# Patient Record
Sex: Female | Born: 1990 | Race: Black or African American | Hispanic: No | Marital: Single | Smoking: Former smoker
Health system: Southern US, Community
[De-identification: ages and names within clinical notes are randomized; demographics above are authoritative.]

## PROBLEM LIST (undated history)

## (undated) ENCOUNTER — Inpatient Hospital Stay (HOSPITAL_COMMUNITY): Payer: Self-pay

## (undated) ENCOUNTER — Emergency Department (HOSPITAL_BASED_OUTPATIENT_CLINIC_OR_DEPARTMENT_OTHER): Admission: EM | Payer: Medicaid Other

## (undated) DIAGNOSIS — D649 Anemia, unspecified: Secondary | ICD-10-CM

## (undated) DIAGNOSIS — I82890 Acute embolism and thrombosis of other specified veins: Secondary | ICD-10-CM

## (undated) DIAGNOSIS — D509 Iron deficiency anemia, unspecified: Secondary | ICD-10-CM

## (undated) HISTORY — DX: Anemia, unspecified: D64.9

## (undated) HISTORY — DX: Acute embolism and thrombosis of other specified veins: I82.890

---
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
## 2009-03-04 ENCOUNTER — Emergency Department (HOSPITAL_COMMUNITY): Admission: EM | Admit: 2009-03-04 | Discharge: 2009-03-05 | Payer: Self-pay | Admitting: Emergency Medicine

## 2009-04-04 ENCOUNTER — Emergency Department (HOSPITAL_COMMUNITY): Admission: EM | Admit: 2009-04-04 | Discharge: 2009-04-04 | Payer: Self-pay | Admitting: Emergency Medicine

## 2009-04-07 ENCOUNTER — Emergency Department (HOSPITAL_COMMUNITY): Admission: EM | Admit: 2009-04-07 | Discharge: 2009-04-07 | Payer: Self-pay | Admitting: Emergency Medicine

## 2009-08-05 ENCOUNTER — Emergency Department (HOSPITAL_COMMUNITY): Admission: EM | Admit: 2009-08-05 | Discharge: 2009-08-05 | Payer: Self-pay | Admitting: Emergency Medicine

## 2010-01-20 ENCOUNTER — Ambulatory Visit (HOSPITAL_COMMUNITY)
Admission: RE | Admit: 2010-01-20 | Discharge: 2010-01-20 | Payer: Self-pay | Source: Home / Self Care | Attending: Family Medicine | Admitting: Family Medicine

## 2010-02-13 ENCOUNTER — Encounter: Payer: Self-pay | Admitting: Obstetrics

## 2010-04-03 ENCOUNTER — Inpatient Hospital Stay (HOSPITAL_COMMUNITY)
Admission: AD | Admit: 2010-04-03 | Discharge: 2010-04-03 | Disposition: A | Discharge status: Home / Self Care | Payer: Medicaid Other | Source: Ambulatory Visit | Attending: Obstetrics and Gynecology | Admitting: Obstetrics and Gynecology

## 2010-04-03 DIAGNOSIS — O479 False labor, unspecified: Secondary | ICD-10-CM | POA: Insufficient documentation

## 2010-04-12 ENCOUNTER — Other Ambulatory Visit: Payer: Self-pay | Admitting: Family Medicine

## 2010-04-12 DIAGNOSIS — O480 Post-term pregnancy: Secondary | ICD-10-CM

## 2010-04-13 ENCOUNTER — Other Ambulatory Visit: Payer: Self-pay | Admitting: Family Medicine

## 2010-04-13 DIAGNOSIS — O480 Post-term pregnancy: Secondary | ICD-10-CM

## 2010-04-15 ENCOUNTER — Ambulatory Visit (HOSPITAL_COMMUNITY)
Admission: RE | Admit: 2010-04-15 | Discharge: 2010-04-15 | Disposition: A | Discharge status: Home / Self Care | Payer: Medicaid Other | Source: Ambulatory Visit | Attending: Family Medicine | Admitting: Family Medicine

## 2010-04-15 ENCOUNTER — Encounter (HOSPITAL_COMMUNITY): Payer: Self-pay

## 2010-04-15 DIAGNOSIS — O480 Post-term pregnancy: Secondary | ICD-10-CM

## 2010-04-15 DIAGNOSIS — O34219 Maternal care for unspecified type scar from previous cesarean delivery: Secondary | ICD-10-CM | POA: Insufficient documentation

## 2010-04-15 DIAGNOSIS — O0930 Supervision of pregnancy with insufficient antenatal care, unspecified trimester: Secondary | ICD-10-CM | POA: Insufficient documentation

## 2010-04-16 ENCOUNTER — Inpatient Hospital Stay (HOSPITAL_COMMUNITY)
Admission: AD | Admit: 2010-04-16 | Discharge: 2010-04-16 | Disposition: A | Discharge status: Home / Self Care | Payer: Medicaid Other | Source: Ambulatory Visit | Attending: Obstetrics & Gynecology | Admitting: Obstetrics & Gynecology

## 2010-04-16 ENCOUNTER — Inpatient Hospital Stay (HOSPITAL_COMMUNITY): Payer: Medicaid Other

## 2010-04-16 DIAGNOSIS — O480 Post-term pregnancy: Secondary | ICD-10-CM

## 2010-04-17 LAB — CULTURE, ROUTINE-ABSCESS

## 2010-04-17 LAB — POCT PREGNANCY, URINE: Preg Test, Ur: NEGATIVE

## 2010-04-18 ENCOUNTER — Ambulatory Visit (HOSPITAL_COMMUNITY): Payer: Medicaid Other

## 2010-04-19 ENCOUNTER — Other Ambulatory Visit: Payer: Medicaid Other

## 2010-04-19 DIAGNOSIS — Z331 Pregnant state, incidental: Secondary | ICD-10-CM

## 2010-04-19 DIAGNOSIS — O480 Post-term pregnancy: Secondary | ICD-10-CM

## 2010-04-21 ENCOUNTER — Ambulatory Visit (HOSPITAL_COMMUNITY)
Admission: RE | Admit: 2010-04-21 | Discharge: 2010-04-21 | Disposition: A | Discharge status: Home / Self Care | Payer: Medicaid Other | Source: Ambulatory Visit | Attending: Family Medicine | Admitting: Family Medicine

## 2010-04-21 DIAGNOSIS — O480 Post-term pregnancy: Secondary | ICD-10-CM

## 2010-04-21 DIAGNOSIS — Z3689 Encounter for other specified antenatal screening: Secondary | ICD-10-CM | POA: Insufficient documentation

## 2010-04-21 DIAGNOSIS — O34219 Maternal care for unspecified type scar from previous cesarean delivery: Secondary | ICD-10-CM | POA: Insufficient documentation

## 2010-04-22 ENCOUNTER — Inpatient Hospital Stay (HOSPITAL_COMMUNITY)
Admission: AD | Admit: 2010-04-22 | Discharge: 2010-04-25 | DRG: 775 | Disposition: A | Discharge status: Home / Self Care | Payer: Medicaid Other | Source: Ambulatory Visit | Attending: Obstetrics & Gynecology | Admitting: Obstetrics & Gynecology

## 2010-04-22 DIAGNOSIS — O480 Post-term pregnancy: Principal | ICD-10-CM | POA: Diagnosis present

## 2010-04-22 DIAGNOSIS — O34219 Maternal care for unspecified type scar from previous cesarean delivery: Secondary | ICD-10-CM | POA: Diagnosis present

## 2010-04-22 DIAGNOSIS — O99892 Other specified diseases and conditions complicating childbirth: Secondary | ICD-10-CM | POA: Diagnosis present

## 2010-04-22 DIAGNOSIS — Z22330 Carrier of Group B streptococcus: Secondary | ICD-10-CM

## 2010-04-22 LAB — CBC
HCT: 31.5 % — ABNORMAL LOW (ref 36.0–46.0)
Hemoglobin: 10.3 g/dL — ABNORMAL LOW (ref 12.0–15.0)
RDW: 13.9 % (ref 11.5–15.5)
WBC: 8.6 10*3/uL (ref 4.0–10.5)

## 2010-04-23 DIAGNOSIS — O9989 Other specified diseases and conditions complicating pregnancy, childbirth and the puerperium: Secondary | ICD-10-CM

## 2010-04-23 DIAGNOSIS — O480 Post-term pregnancy: Secondary | ICD-10-CM

## 2010-04-23 DIAGNOSIS — O34219 Maternal care for unspecified type scar from previous cesarean delivery: Secondary | ICD-10-CM

## 2010-04-23 LAB — RPR: RPR Ser Ql: NONREACTIVE

## 2010-04-27 LAB — CULTURE, ROUTINE-ABSCESS

## 2010-04-30 LAB — CBC
HCT: 31.8 % — ABNORMAL LOW (ref 36.0–46.0)
MCHC: 33.3 g/dL (ref 30.0–36.0)
Platelets: 305 10*3/uL (ref 150–400)
RBC: 3.68 MIL/uL — ABNORMAL LOW (ref 3.87–5.11)
RDW: 18.5 % — ABNORMAL HIGH (ref 11.5–15.5)
WBC: 10.9 10*3/uL — ABNORMAL HIGH (ref 4.0–10.5)

## 2010-05-09 LAB — GC/CHLAMYDIA PROBE AMP, GENITAL: GC Probe Amp, Genital: NEGATIVE

## 2010-05-09 LAB — URINALYSIS, ROUTINE W REFLEX MICROSCOPIC
Hgb urine dipstick: NEGATIVE
Nitrite: NEGATIVE
Protein, ur: NEGATIVE mg/dL
Specific Gravity, Urine: 1.02 (ref 1.005–1.030)
Urobilinogen, UA: 0.2 mg/dL (ref 0.0–1.0)
Urobilinogen, UA: 0.2 mg/dL (ref 0.0–1.0)

## 2010-05-09 LAB — WET PREP, GENITAL: Clue Cells Wet Prep HPF POC: NONE SEEN

## 2010-05-16 ENCOUNTER — Emergency Department (HOSPITAL_COMMUNITY)
Admission: EM | Admit: 2010-05-16 | Discharge: 2010-05-16 | Disposition: A | Discharge status: Home / Self Care | Payer: Medicaid Other | Attending: Emergency Medicine | Admitting: Emergency Medicine

## 2010-05-16 DIAGNOSIS — IMO0002 Reserved for concepts with insufficient information to code with codable children: Secondary | ICD-10-CM | POA: Insufficient documentation

## 2010-06-26 IMAGING — US US OB TRANSVAGINAL MODIFY
1 series · 14 of 28 positions shown · non-contrast
Comparison: none

CLINICAL DATA: Cramping pelvic pain.

OBSTETRIC <14 WK ULTRASOUND
TECHNIQUE: Transabdominal ultrasound was performed for evaluation
of the gestation as well as the maternal uterus and adnexal
regions.

[Series 1: us ob transvaginal modify · 0.17mm/px · 14 of 39 slices shown]
[im 2/39]
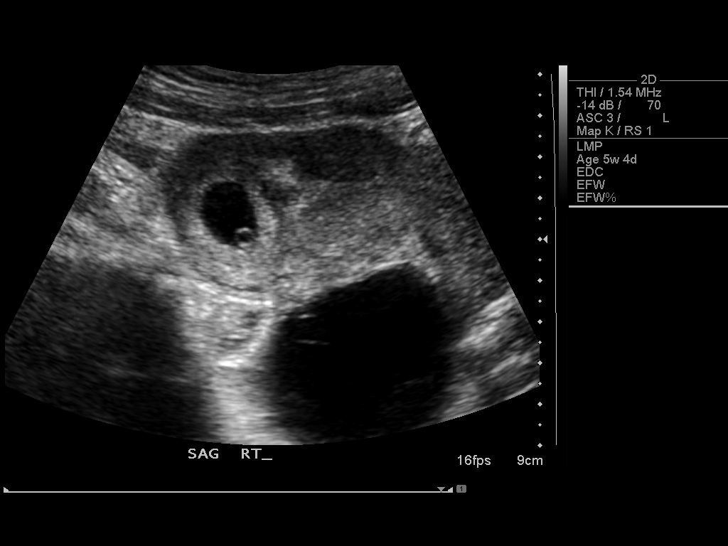
[im 5/39]
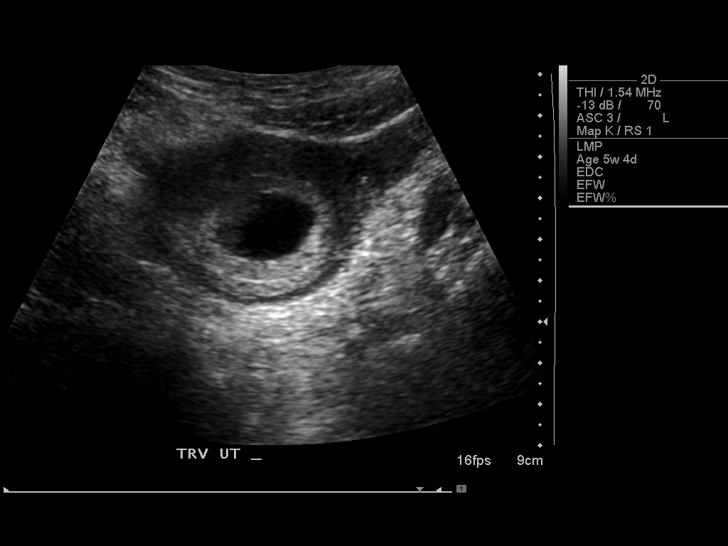
[im 8/39]
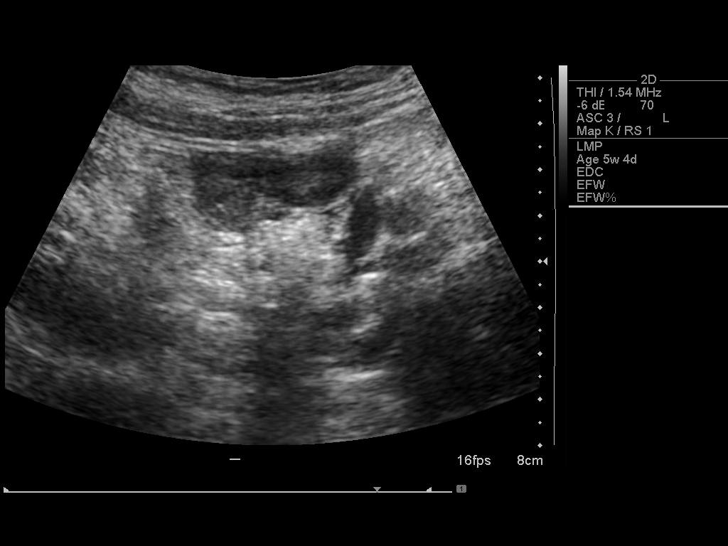
[im 10/39]
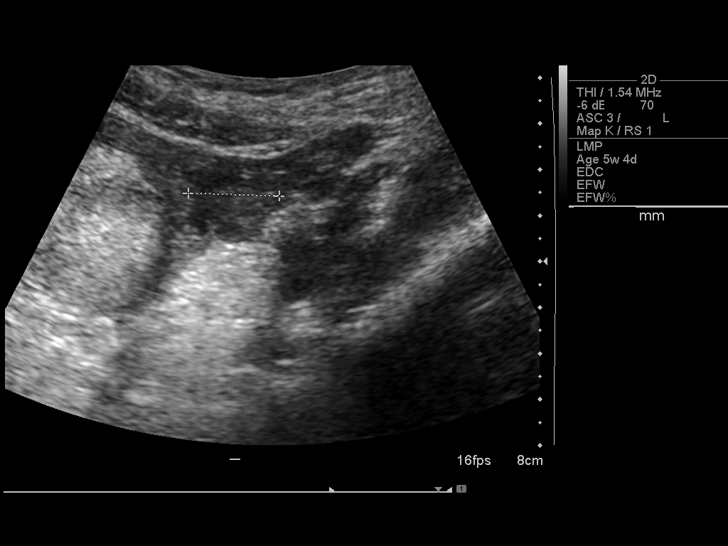
[im 13/39]
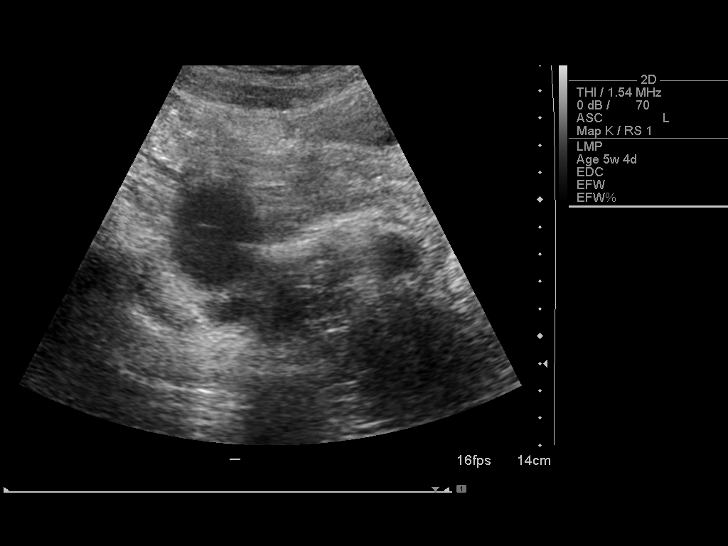
[im 16/39]
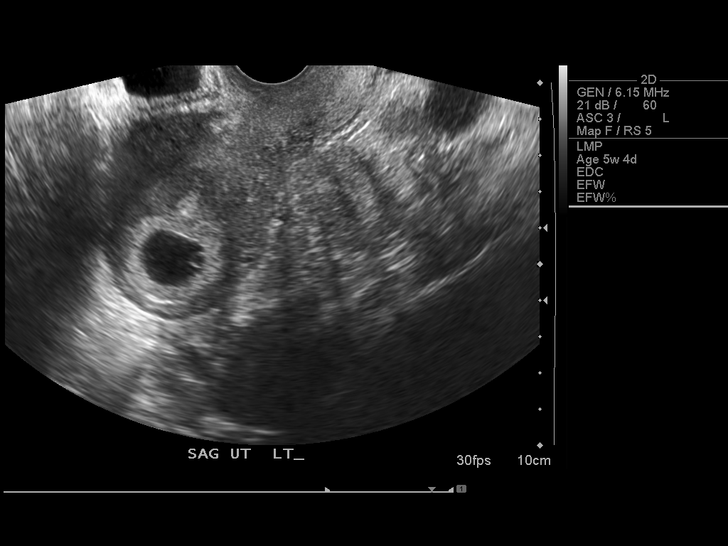
[im 19/39]
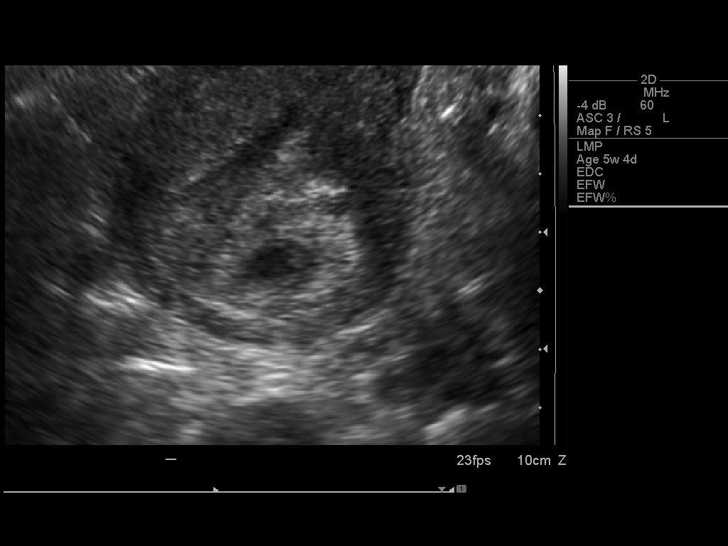
[im 22/39]
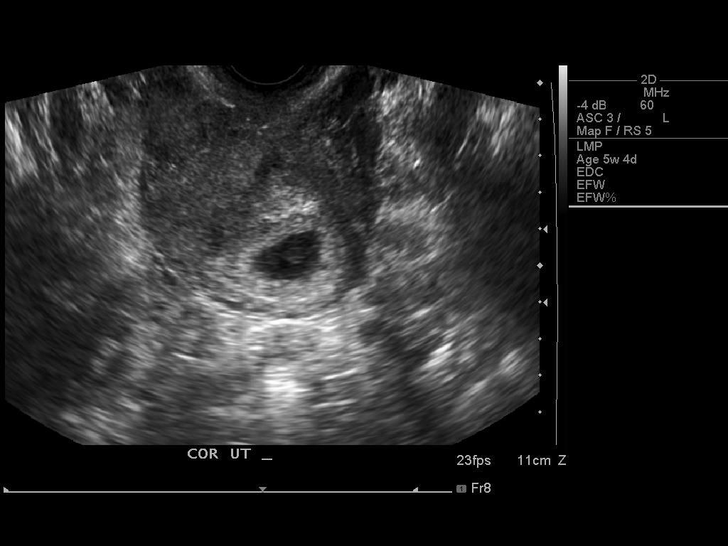
[im 24/39]
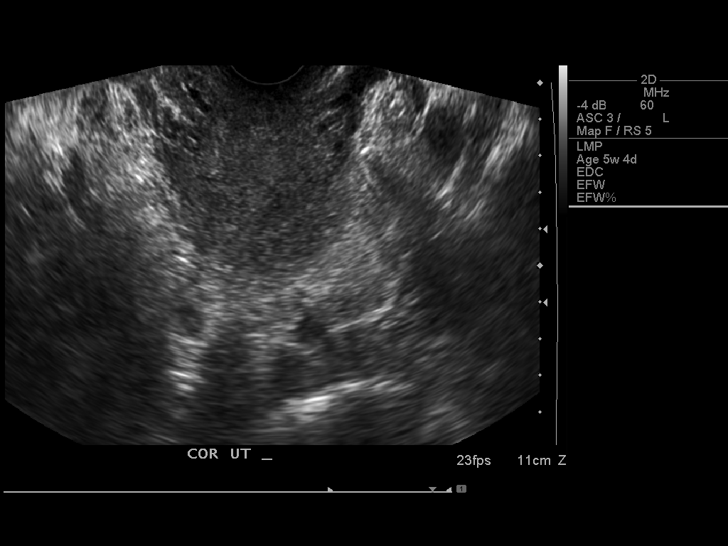
[im 27/39]
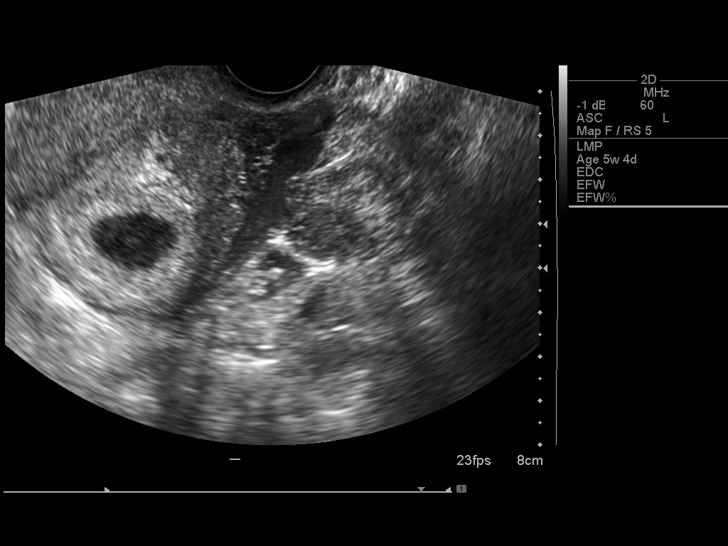
[im 30/39]
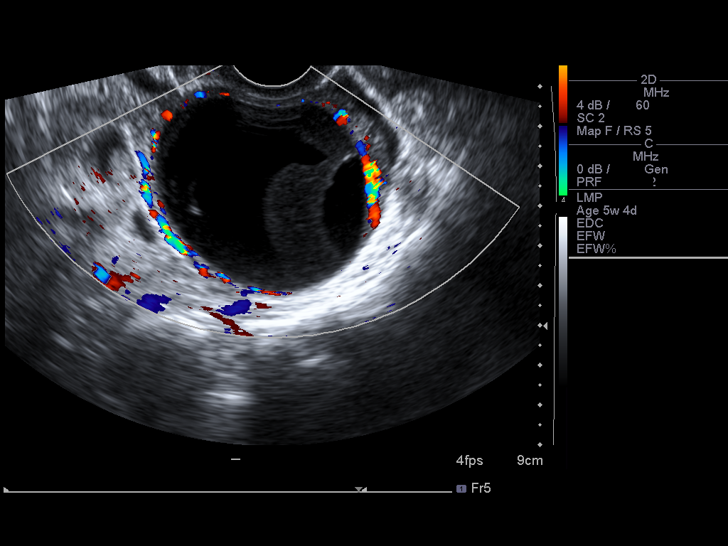
[im 33/39]
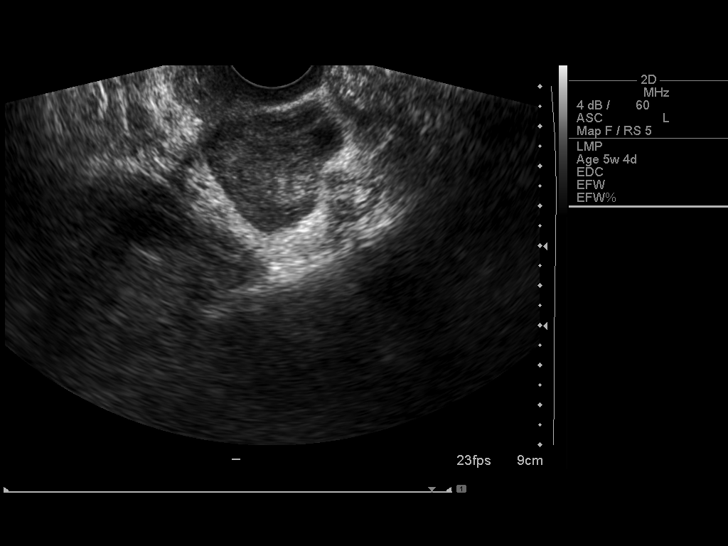
[im 36/39]
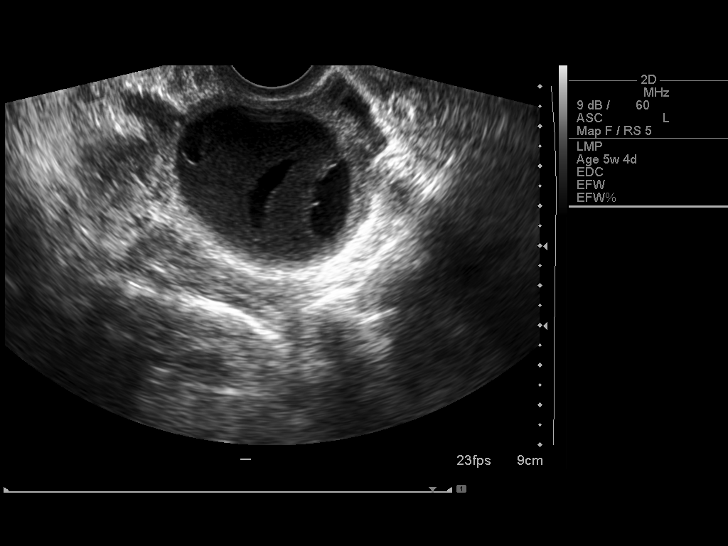
[im 39/39]
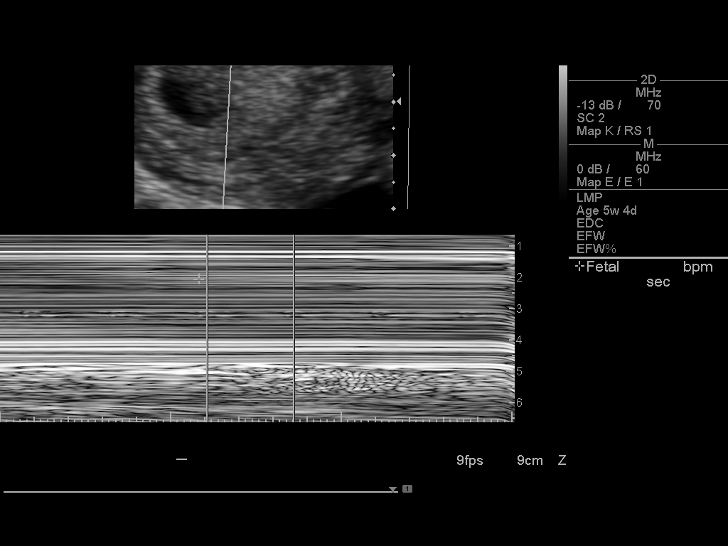

[14 of 28 positions shown; findings below may reference images not displayed]

FINDINGS: There is a single intrauterine gestational sac.  A small
embryo is visualized and is small yolk sac is seen.  Cardiac
activity is noted with a heart rate of 118 beats per minute.  The
crown rump length was 3.9 mm which correlates with a 6-week-8-day
embryo.

No subchorionic hemorrhage is seen.  No free pelvic fluid
collections.  There is a large complex cyst associated with the
right ovary.  This measures 5.0 x 4.9 cm and has internal debris.
This is most likely a large hemorrhagic corpus luteum cyst and may
be responsible for the patient's pain.
IMPRESSION: 1.  Single living intrauterine embryo estimated at 6 weeks and 1
day is gestation.
2.  A small yolk sac is noted.
3.  No subchorionic hemorrhage.
4.  Large hemorrhagic corpus luteum cyst associated with the right
ovary.  This may be responsible for the patient's pelvic pain.
5.  No significant free pelvic fluid collections.

## 2010-10-21 LAB — POCT URINALYSIS DIP (DEVICE)
Glucose, UA: NEGATIVE
Ketones, ur: NEGATIVE
Operator id: 29721
Protein, ur: NEGATIVE
Specific Gravity, Urine: 1.025

## 2010-10-21 LAB — WET PREP, GENITAL: Yeast Wet Prep HPF POC: NONE SEEN

## 2010-10-21 LAB — POCT PREGNANCY, URINE
Operator id: 208841
Preg Test, Ur: NEGATIVE

## 2010-10-28 LAB — CULTURE, ROUTINE-ABSCESS

## 2010-10-28 LAB — WET PREP, GENITAL
Clue Cells Wet Prep HPF POC: NONE SEEN
Trich, Wet Prep: NONE SEEN
Yeast Wet Prep HPF POC: NONE SEEN

## 2010-10-28 LAB — GC/CHLAMYDIA PROBE AMP, GENITAL
Chlamydia, DNA Probe: NEGATIVE
GC Probe Amp, Genital: NEGATIVE

## 2010-10-28 LAB — HCG, QUANTITATIVE, PREGNANCY: hCG, Beta Chain, Quant, S: 67741 m[IU]/mL — ABNORMAL HIGH (ref <5)

## 2010-11-01 LAB — DIFFERENTIAL
Basophils Absolute: 0
Basophils Relative: 0
Eosinophils Absolute: 0
Eosinophils Relative: 1
Lymphs Abs: 2.6
Neutrophils Relative %: 60

## 2010-11-01 LAB — WET PREP, GENITAL
Clue Cells Wet Prep HPF POC: NONE SEEN
Trich, Wet Prep: NONE SEEN
Yeast Wet Prep HPF POC: NONE SEEN

## 2010-11-01 LAB — URINE CULTURE

## 2010-11-01 LAB — LIPASE, BLOOD: Lipase: 25

## 2010-11-01 LAB — CBC
Hemoglobin: 11.8 — ABNORMAL LOW
MCHC: 33
MCV: 92.8
RBC: 3.84
WBC: 8.3

## 2010-11-01 LAB — COMPREHENSIVE METABOLIC PANEL
ALT: 16
CO2: 25
Calcium: 9.2
Chloride: 104
Glucose, Bld: 84
Sodium: 135
Total Bilirubin: 0.5

## 2010-11-01 LAB — URINALYSIS, ROUTINE W REFLEX MICROSCOPIC
Nitrite: NEGATIVE
Specific Gravity, Urine: 1.029
Urobilinogen, UA: 0.2

## 2010-11-01 LAB — T3, FREE: T3, Free: 3.1 (ref 2.3–4.2)

## 2010-11-01 LAB — GC/CHLAMYDIA PROBE AMP, GENITAL: GC Probe Amp, Genital: NEGATIVE

## 2010-11-04 LAB — GC/CHLAMYDIA PROBE AMP, GENITAL
Chlamydia, DNA Probe: NEGATIVE
GC Probe Amp, Genital: POSITIVE — AB

## 2010-11-04 LAB — URINALYSIS, ROUTINE W REFLEX MICROSCOPIC
Bilirubin Urine: NEGATIVE
Glucose, UA: NEGATIVE
Hgb urine dipstick: NEGATIVE
Ketones, ur: 15 — AB
Nitrite: NEGATIVE
Protein, ur: NEGATIVE
Specific Gravity, Urine: 1.028
Urobilinogen, UA: 1
pH: 6

## 2010-11-04 LAB — POCT PREGNANCY, URINE
Operator id: 28944
Preg Test, Ur: NEGATIVE

## 2010-11-04 LAB — RPR: RPR Ser Ql: NONREACTIVE

## 2010-11-04 LAB — WET PREP, GENITAL
Trich, Wet Prep: NONE SEEN
Yeast Wet Prep HPF POC: NONE SEEN

## 2010-11-04 LAB — URINE MICROSCOPIC-ADD ON

## 2011-01-24 NOTE — L&D Delivery Note (Signed)
I have seen and examined this patient and agree the above assessment. CRESENZO-DISHMAN,Willian Donson 10/05/2011 5:10 AM

## 2011-01-24 NOTE — L&D Delivery Note (Signed)
Delivery Note At 4:08 AM a viable and healthy female was delivered via VBAC, Spontaneous (Presentation: ; Left Occiput Anterior).  APGAR: 8, 9; weight pending.   Placenta status: Intact, Spontaneous.  Easily reducible 1x nuchal cord.  Cord: 3 vessels with the following complications: None.   Shoulders easily delivered after some effort.  Anesthesia:  None  Episiotomy: None Lacerations: 1st degree Suture Repair: n/a Est. Blood Loss (mL): 300  Mom to postpartum.  Baby to nursery-stable.  Simone Curia 10/05/2011, 4:31 AM

## 2011-05-01 ENCOUNTER — Inpatient Hospital Stay (HOSPITAL_COMMUNITY)
Admission: AD | Admit: 2011-05-01 | Discharge: 2011-05-01 | Disposition: A | Discharge status: Home / Self Care | Payer: Medicaid Other | Source: Ambulatory Visit | Attending: Obstetrics and Gynecology | Admitting: Obstetrics and Gynecology

## 2011-05-01 ENCOUNTER — Encounter (HOSPITAL_COMMUNITY): Payer: Self-pay

## 2011-05-01 DIAGNOSIS — O209 Hemorrhage in early pregnancy, unspecified: Secondary | ICD-10-CM | POA: Insufficient documentation

## 2011-05-01 DIAGNOSIS — Z3480 Encounter for supervision of other normal pregnancy, unspecified trimester: Secondary | ICD-10-CM

## 2011-05-01 DIAGNOSIS — O0930 Supervision of pregnancy with insufficient antenatal care, unspecified trimester: Secondary | ICD-10-CM | POA: Insufficient documentation

## 2011-05-01 LAB — URINE MICROSCOPIC-ADD ON

## 2011-05-01 LAB — URINALYSIS, ROUTINE W REFLEX MICROSCOPIC
Bilirubin Urine: NEGATIVE
Hgb urine dipstick: NEGATIVE
Specific Gravity, Urine: 1.015 (ref 1.005–1.030)
pH: 8 (ref 5.0–8.0)

## 2011-05-01 NOTE — Discharge Instructions (Signed)
Pregnancy - Second Trimester The second trimester of pregnancy (3 to 6 months) is a period of rapid growth for you and your baby. At the end of the sixth month, your baby is about 9 inches long and weighs 1 1/2 pounds. You will begin to feel the baby move between 18 and 20 weeks of the pregnancy. This is called quickening. Weight gain is faster. A clear fluid (colostrum) may leak out of your breasts. You may feel small contractions of the womb (uterus). This is known as false labor or Braxton-Hicks contractions. This is like a practice for labor when the baby is ready to be born. Usually, the problems with morning sickness have usually passed by the end of your first trimester. Some women develop small dark blotches (called cholasma, mask of pregnancy) on their face that usually goes away after the baby is born. Exposure to the sun makes the blotches worse. Acne may also develop in some pregnant women and pregnant women who have acne, may find that it goes away. PRENATAL EXAMS  Blood work may continue to be done during prenatal exams. These tests are done to check on your health and the probable health of your baby. Blood work is used to follow your blood levels (hemoglobin). Anemia (low hemoglobin) is common during pregnancy. Iron and vitamins are given to help prevent this. You will also be checked for diabetes between 24 and 28 weeks of the pregnancy. Some of the previous blood tests may be repeated.   The size of the uterus is measured during each visit. This is to make sure that the baby is continuing to grow properly according to the dates of the pregnancy.   Your blood pressure is checked every prenatal visit. This is to make sure you are not getting toxemia.   Your urine is checked to make sure you do not have an infection, diabetes or protein in the urine.   Your weight is checked often to make sure gains are happening at the suggested rate. This is to ensure that both you and your baby are  growing normally.   Sometimes, an ultrasound is performed to confirm the proper growth and development of the baby. This is a test which bounces harmless sound waves off the baby so your caregiver can more accurately determine due dates.  Sometimes, a specialized test is done on the amniotic fluid surrounding the baby. This test is called an amniocentesis. The amniotic fluid is obtained by sticking a needle into the belly (abdomen). This is done to check the chromosomes in instances where there is a concern about possible genetic problems with the baby. It is also sometimes done near the end of pregnancy if an early delivery is required. In this case, it is done to help make sure the baby's lungs are mature enough for the baby to live outside of the womb. CHANGES OCCURING IN THE SECOND TRIMESTER OF PREGNANCY Your body goes through many changes during pregnancy. They vary from person to person. Talk to your caregiver about changes you notice that you are concerned about.  During the second trimester, you will likely have an increase in your appetite. It is normal to have cravings for certain foods. This varies from person to person and pregnancy to pregnancy.   Your lower abdomen will begin to bulge.   You may have to urinate more often because the uterus and baby are pressing on your bladder. It is also common to get more bladder infections during pregnancy (  pain with urination). You can help this by drinking lots of fluids and emptying your bladder before and after intercourse.   You may begin to get stretch marks on your hips, abdomen, and breasts. These are normal changes in the body during pregnancy. There are no exercises or medications to take that prevent this change.   You may begin to develop swollen and bulging veins (varicose veins) in your legs. Wearing support hose, elevating your feet for 15 minutes, 3 to 4 times a day and limiting salt in your diet helps lessen the problem.    Heartburn may develop as the uterus grows and pushes up against the stomach. Antacids recommended by your caregiver helps with this problem. Also, eating smaller meals 4 to 5 times a day helps.   Constipation can be treated with a stool softener or adding bulk to your diet. Drinking lots of fluids, vegetables, fruits, and whole grains are helpful.   Exercising is also helpful. If you have been very active up until your pregnancy, most of these activities can be continued during your pregnancy. If you have been less active, it is helpful to start an exercise program such as walking.   Hemorrhoids (varicose veins in the rectum) may develop at the end of the second trimester. Warm sitz baths and hemorrhoid cream recommended by your caregiver helps hemorrhoid problems.   Backaches may develop during this time of your pregnancy. Avoid heavy lifting, wear low heal shoes and practice good posture to help with backache problems.   Some pregnant women develop tingling and numbness of their hand and fingers because of swelling and tightening of ligaments in the wrist (carpel tunnel syndrome). This goes away after the baby is born.   As your breasts enlarge, you may have to get a bigger bra. Get a comfortable, cotton, support bra. Do not get a nursing bra until the last month of the pregnancy if you will be nursing the baby.   You may get a dark line from your belly button to the pubic area called the linea nigra.   You may develop rosy cheeks because of increase blood flow to the face.   You may develop spider looking lines of the face, neck, arms and chest. These go away after the baby is born.  HOME CARE INSTRUCTIONS   It is extremely important to avoid all smoking, herbs, alcohol, and unprescribed drugs during your pregnancy. These chemicals affect the formation and growth of the baby. Avoid these chemicals throughout the pregnancy to ensure the delivery of a healthy infant.   Most of your home  care instructions are the same as suggested for the first trimester of your pregnancy. Keep your caregiver's appointments. Follow your caregiver's instructions regarding medication use, exercise and diet.   During pregnancy, you are providing food for you and your baby. Continue to eat regular, well-balanced meals. Choose foods such as meat, fish, milk and other low fat dairy products, vegetables, fruits, and whole-grain breads and cereals. Your caregiver will tell you of the ideal weight gain.   A physical sexual relationship may be continued up until near the end of pregnancy if there are no other problems. Problems could include early (premature) leaking of amniotic fluid from the membranes, vaginal bleeding, abdominal pain, or other medical or pregnancy problems.   Exercise regularly if there are no restrictions. Check with your caregiver if you are unsure of the safety of some of your exercises. The greatest weight gain will occur in the   last 2 trimesters of pregnancy. Exercise will help you:   Control your weight.   Get you in shape for labor and delivery.   Lose weight after you have the baby.   Wear a good support or jogging bra for breast tenderness during pregnancy. This may help if worn during sleep. Pads or tissues may be used in the bra if you are leaking colostrum.   Do not use hot tubs, steam rooms or saunas throughout the pregnancy.   Wear your seat belt at all times when driving. This protects you and your baby if you are in an accident.   Avoid raw meat, uncooked cheese, cat litter boxes and soil used by cats. These carry germs that can cause birth defects in the baby.   The second trimester is also a good time to visit your dentist for your dental health if this has not been done yet. Getting your teeth cleaned is OK. Use a soft toothbrush. Brush gently during pregnancy.   It is easier to loose urine during pregnancy. Tightening up and strengthening the pelvic muscles will  help with this problem. Practice stopping your urination while you are going to the bathroom. These are the same muscles you need to strengthen. It is also the muscles you would use as if you were trying to stop from passing gas. You can practice tightening these muscles up 10 times a set and repeating this about 3 times per day. Once you know what muscles to tighten up, do not perform these exercises during urination. It is more likely to contribute to an infection by backing up the urine.   Ask for help if you have financial, counseling or nutritional needs during pregnancy. Your caregiver will be able to offer counseling for these needs as well as refer you for other special needs.   Your skin may become oily. If so, wash your face with mild soap, use non-greasy moisturizer and oil or cream based makeup.  MEDICATIONS AND DRUG USE IN PREGNANCY  Take prenatal vitamins as directed. The vitamin should contain 1 milligram of folic acid. Keep all vitamins out of reach of children. Only a couple vitamins or tablets containing iron may be fatal to a baby or young child when ingested.   Avoid use of all medications, including herbs, over-the-counter medications, not prescribed or suggested by your caregiver. Only take over-the-counter or prescription medicines for pain, discomfort, or fever as directed by your caregiver. Do not use aspirin.   Let your caregiver also know about herbs you may be using.   Alcohol is related to a number of birth defects. This includes fetal alcohol syndrome. All alcohol, in any form, should be avoided completely. Smoking will cause low birth rate and premature babies.   Street or illegal drugs are very harmful to the baby. They are absolutely forbidden. A baby born to an addicted mother will be addicted at birth. The baby will go through the same withdrawal an adult does.  SEEK MEDICAL CARE IF:  You have any concerns or worries during your pregnancy. It is better to call with  your questions if you feel they cannot wait, rather than worry about them. SEEK IMMEDIATE MEDICAL CARE IF:   An unexplained oral temperature above 102 F (38.9 C) develops, or as your caregiver suggests.   You have leaking of fluid from the vagina (birth canal). If leaking membranes are suspected, take your temperature and tell your caregiver of this when you call.   There   is vaginal spotting, bleeding, or passing clots. Tell your caregiver of the amount and how many pads are used. Light spotting in pregnancy is common, especially following intercourse.   You develop a bad smelling vaginal discharge with a change in the color from clear to white.   You continue to feel sick to your stomach (nauseated) and have no relief from remedies suggested. You vomit blood or coffee ground-like materials.   You lose more than 2 pounds of weight or gain more than 2 pounds of weight over 1 week, or as suggested by your caregiver.   You notice swelling of your face, hands, feet, or legs.   You get exposed to German measles and have never had them.   You are exposed to fifth disease or chickenpox.   You develop belly (abdominal) pain. Round ligament discomfort is a common non-cancerous (benign) cause of abdominal pain in pregnancy. Your caregiver still must evaluate you.   You develop a bad headache that does not go away.   You develop fever, diarrhea, pain with urination, or shortness of breath.   You develop visual problems, blurry, or double vision.   You fall or are in a car accident or any kind of trauma.   There is mental or physical violence at home.  Document Released: 01/03/2001 Document Revised: 12/29/2010 Document Reviewed: 07/08/2008 [employer] Patient Information 2012 [employer]. 

## 2011-05-01 NOTE — MAU Note (Signed)
Patient is here to get a confirmation of pregnancy. She states that her lmp was 01/18/2011 and she was having intermittent vaginal bleeding months January through march, so she assumed that she has miscarriaged. She states that she has not had any bleeding this month or pain. She wants an ultrasound to make sure that everything is fine with the fetus (her history of having a baby with spina bifida and another with extra digits).

## 2011-05-01 NOTE — MAU Provider Note (Signed)
Alicia L Harrison21 y.o.[id number] @[redacted]w[redacted]d  by LMP No chief complaint on file.    First Provider Initiated Contact with Patient 05/01/11 1129      SUBJECTIVE  HPI: Pt presents to MAU with report of pregnancy that she thought was a miscarriage in January.  Pt reports LNMP was in November but she had light bright red bleeding in January and February.  She was uncertain about her plans to keep this pregnancy and did not come in to be evaluated when she thought she was miscarrying. She has one child with spina bifida and one had an extra digit at birth on one hand so she is concerned about anomalies for this baby.  She states that at this point she would like to keep the pregnancy because she is so far along and it might be a good sign that she is still pregnant even after the bleeding.     History reviewed. No pertinent past medical history. Past Surgical History  Procedure Date  . Cesarean section    History   Social History  . Marital Status: Single    Spouse Name: N/A    Number of Children: N/A  . Years of Education: N/A   Occupational History  . Not on file.   Social History Main Topics  . Smoking status: Current Everyday Smoker -- 0.5 packs/day  . Smokeless tobacco: Not on file  . Alcohol Use: No  . Drug Use: No  . Sexually Active: Yes    Birth Control/ Protection: None   Other Topics Concern  . Not on file   Social History Narrative  . No narrative on file   No current facility-administered medications on file prior to encounter.   No current outpatient prescriptions on file prior to encounter.   No Known Allergies  ROS: Pertinent items in HPI  OBJECTIVE Blood pressure 116/67, pulse 84, temperature 97.3 F (36.3 C), temperature source Oral, resp. rate 16, height 5\' 2"  (1.575 m), weight 62.71 kg (138 lb 4 oz), last menstrual period 01/18/2011. GENERAL: Well-developed, well-nourished female in no acute distress.  LUNGS: Clear to auscultation bilaterally.  HEART:  Regular rate and rhythm. ABDOMEN: Soft, nontender EXTREMITIES: Nontender, no edema Pelvic exam deferred  FHT 160 via doppler  LAB RESULTS Results for orders placed during the hospital encounter of 05/01/11 (from the past 24 hour(s))  URINALYSIS, ROUTINE W REFLEX MICROSCOPIC     Status: Abnormal   Collection Time   05/01/11 10:45 AM      Component Value Range   Color, Urine YELLOW  YELLOW    APPearance HAZY (*) CLEAR    Specific Gravity, Urine 1.015  1.005 - 1.030    pH 8.0  5.0 - 8.0    Glucose, UA NEGATIVE  NEGATIVE (mg/dL)   Hgb urine dipstick NEGATIVE  NEGATIVE    Bilirubin Urine NEGATIVE  NEGATIVE    Ketones, ur NEGATIVE  NEGATIVE (mg/dL)   Protein, ur NEGATIVE  NEGATIVE (mg/dL)   Urobilinogen, UA 0.2  0.0 - 1.0 (mg/dL)   Nitrite NEGATIVE  NEGATIVE    Leukocytes, UA SMALL (*) NEGATIVE   URINE MICROSCOPIC-ADD ON     Status: Abnormal   Collection Time   05/01/11 10:45 AM      Component Value Range   Squamous Epithelial / LPF MANY (*) RARE    WBC, UA 11-20  <3 (WBC/hpf)   Bacteria, UA RARE  RARE   POCT PREGNANCY, URINE     Status: Abnormal   Collection Time  05/01/11 11:13 AM      Component Value Range   Preg Test, Ur POSITIVE (*) NEGATIVE     IMAGING Not indicated  ASSESSMENT IUP [redacted]w[redacted]d by LMP  PLAN  D/C home Scheduled outpatient anatomy scan Begin prenatal care as soon as possible Return to MAU as needed  LEFTWICH-KIRBY, Martia Dalby Certified Nurse-Midwife

## 2011-05-11 ENCOUNTER — Encounter (HOSPITAL_COMMUNITY): Payer: Self-pay | Admitting: *Deleted

## 2011-05-11 ENCOUNTER — Inpatient Hospital Stay (HOSPITAL_COMMUNITY): Payer: Medicaid Other

## 2011-05-11 ENCOUNTER — Inpatient Hospital Stay (HOSPITAL_COMMUNITY)
Admission: AD | Admit: 2011-05-11 | Discharge: 2011-05-11 | Disposition: A | Discharge status: Home / Self Care | Payer: Medicaid Other | Source: Ambulatory Visit | Attending: Obstetrics & Gynecology | Admitting: Obstetrics & Gynecology

## 2011-05-11 DIAGNOSIS — O26859 Spotting complicating pregnancy, unspecified trimester: Secondary | ICD-10-CM | POA: Insufficient documentation

## 2011-05-11 LAB — URINALYSIS, ROUTINE W REFLEX MICROSCOPIC
Protein, ur: NEGATIVE mg/dL
Urobilinogen, UA: 1 mg/dL (ref 0.0–1.0)

## 2011-05-11 LAB — WET PREP, GENITAL: Clue Cells Wet Prep HPF POC: NONE SEEN

## 2011-05-11 LAB — URINE MICROSCOPIC-ADD ON

## 2011-05-11 LAB — ABO/RH

## 2011-05-11 NOTE — MAU Note (Signed)
Pt complaining of vaginal bleeding with small clots the patient states she has not started prenatal care and she was not able to get an appointment with [facility] DEPT< But was not able to get an appointment until 5/30. Patient also complaining of sharp pains in pelvic area.

## 2011-05-11 NOTE — MAU Note (Signed)
Pt reports continued bleeding off/on with preg. States she has been seen but has not had an u/s. States the bleeding stopped for about a month but then started back and it is dark and only on the tissue when she wipes. Last intercourse was 2 weeks ago.

## 2011-05-11 NOTE — MAU Note (Signed)
Pt also reports occassional pain in lower abd, none now.

## 2011-05-11 NOTE — MAU Provider Note (Signed)
History     CSN: [id number]  Arrival date and time: 05/11/11 2053   None     Chief Complaint  Patient presents with  . Vaginal Bleeding   HPI 21 y.o. [id number] at [redacted]w[redacted]d with intermittent vaginal bleeding/brown discharge throughout pregnancy, bleeding with wiping only, + dysuria, intermittent bilateral low abd pain, mostly with laying down. No prenatal care yet, has appt at [facility] on May 30th.    History reviewed. No pertinent past medical history.  Past Surgical History  Procedure Date  . Cesarean section     Family History  Problem Relation Age of Onset  . Hypertension Mother     History  Substance Use Topics  . Smoking status: Former Smoker -- 0.5 packs/day    Quit date: 05/01/2011  . Smokeless tobacco: Not on file  . Alcohol Use: No    Allergies: No Known Allergies  Prescriptions prior to admission  Medication Sig Dispense Refill  . Prenatal Vit-Fe Fumarate-FA (PRENATAL MULTIVITAMIN) TABS Take 1 tablet by mouth every morning.        Review of Systems  Constitutional: Negative.   Respiratory: Negative.   Cardiovascular: Negative.   Gastrointestinal: Positive for abdominal pain. Negative for nausea, vomiting, diarrhea and constipation.  Genitourinary: Positive for dysuria. Negative for urgency, frequency, hematuria and flank pain.       Positive for vagina bleeding andl discharge  Musculoskeletal: Negative.   Neurological: Negative.   Psychiatric/Behavioral: Negative.    Physical Exam   Blood pressure 131/62, pulse 88, temperature 97.4 F (36.3 C), temperature source Oral, resp. rate 18, height 5\' 1"  (1.549 m), weight 139 lb (63.05 kg), last menstrual period 01/18/2011, SpO2 100.00%.  Physical Exam  Nursing note and vitals reviewed. Constitutional: She is oriented to person, place, and time. She appears well-developed and well-nourished. No distress.  HENT:  Head: Normocephalic and atraumatic.  Cardiovascular: Normal rate.   Respiratory: Effort normal.    GI: Soft. Bowel sounds are normal. She exhibits no mass. There is no tenderness. There is no rebound and no guarding.  Genitourinary: There is no rash or lesion on the right labia. There is no rash or lesion on the left labia. Uterus is not tender. Enlarged: Size c/w dates. Cervix exhibits no motion tenderness, no discharge and no friability. Right adnexum displays no mass, no tenderness and no fullness. Left adnexum displays no mass, no tenderness and no fullness. No tenderness or bleeding around the vagina. No vaginal discharge found.  Musculoskeletal: Normal range of motion.  Neurological: She is alert and oriented to person, place, and time.  Skin: Skin is warm and dry.  Psychiatric: She has a normal mood and affect.    MAU Course  Procedures Results for orders placed during the hospital encounter of 05/11/11 (from the past 24 hour(s))  ABO/RH     Status: Normal      Component Value Range   RH-Type Positive     ABO Grouping A    URINALYSIS, ROUTINE W REFLEX MICROSCOPIC     Status: Abnormal   Collection Time   05/11/11  9:07 PM      Component Value Range   Color, Urine YELLOW  YELLOW    APPearance CLOUDY (*) CLEAR    Specific Gravity, Urine 1.020  1.005 - 1.030    pH 7.0  5.0 - 8.0    Glucose, UA NEGATIVE  NEGATIVE (mg/dL)   Hgb urine dipstick NEGATIVE  NEGATIVE    Bilirubin Urine NEGATIVE  NEGATIVE  Ketones, ur NEGATIVE  NEGATIVE (mg/dL)   Protein, ur NEGATIVE  NEGATIVE (mg/dL)   Urobilinogen, UA 1.0  0.0 - 1.0 (mg/dL)   Nitrite NEGATIVE  NEGATIVE    Leukocytes, UA SMALL (*) NEGATIVE   URINE MICROSCOPIC-ADD ON     Status: Abnormal   Collection Time   05/11/11  9:07 PM      Component Value Range   Squamous Epithelial / LPF MANY (*) RARE    WBC, UA 7-10  <3 (WBC/hpf)   Bacteria, UA MANY (*) RARE    Urine-Other AMORPHOUS URATES/PHOSPHATES    WET PREP, GENITAL     Status: Abnormal   Collection Time   05/11/11  9:35 PM      Component Value Range   Yeast Wet Prep HPF POC  NONE SEEN  NONE SEEN    Trich, Wet Prep NONE SEEN  NONE SEEN    Clue Cells Wet Prep HPF POC NONE SEEN  NONE SEEN    WBC, Wet Prep HPF POC FEW (*) NONE SEEN       Assessment and Plan  21 y.o. [id number] at [redacted]w[redacted]d Spotting in pregnancy - no evidence of bleeding at this time Urine culture sent F/U for prenatal care as scheduled  Amelie Caracci 05/11/2011, 11:02 PM

## 2011-05-11 NOTE — Progress Notes (Signed)
Speculum exam done. Cultures obtained. By Telford Nab CNM

## 2011-05-12 ENCOUNTER — Encounter: Payer: Self-pay | Admitting: Obstetrics & Gynecology

## 2011-05-12 DIAGNOSIS — Z3490 Encounter for supervision of normal pregnancy, unspecified, unspecified trimester: Secondary | ICD-10-CM | POA: Insufficient documentation

## 2011-05-12 LAB — URINE CULTURE: Colony Count: 60000

## 2011-05-12 LAB — GC/CHLAMYDIA PROBE AMP, GENITAL: GC Probe Amp, Genital: NEGATIVE

## 2011-05-12 NOTE — MAU Provider Note (Signed)
Attestation of Attending Supervision of Advanced Practitioner: Evaluation and management procedures were performed by the OB Fellow/PA/CNM/NP under my supervision and collaboration. Chart reviewed, and agree with management and plan.  Kabeer Hoagland, M.D. 05/12/2011 2:15 PM   

## 2011-05-15 NOTE — MAU Provider Note (Signed)
Agree with above note.  Alicia Branch 05/15/2011 9:34 AM

## 2011-05-18 ENCOUNTER — Ambulatory Visit (HOSPITAL_COMMUNITY): Payer: Medicaid Other

## 2011-05-19 ENCOUNTER — Ambulatory Visit (HOSPITAL_COMMUNITY)
Admission: RE | Admit: 2011-05-19 | Discharge: 2011-05-19 | Disposition: A | Discharge status: Home / Self Care | Payer: Medicaid Other | Source: Ambulatory Visit | Attending: Advanced Practice Midwife | Admitting: Advanced Practice Midwife

## 2011-05-19 DIAGNOSIS — Z3480 Encounter for supervision of other normal pregnancy, unspecified trimester: Secondary | ICD-10-CM

## 2011-06-21 LAB — OB RESULTS CONSOLE RUBELLA ANTIBODY, IGM: Rubella: IMMUNE

## 2011-06-21 LAB — OB RESULTS CONSOLE ANTIBODY SCREEN: Antibody Screen: NEGATIVE

## 2011-06-21 LAB — OB RESULTS CONSOLE HIV ANTIBODY (ROUTINE TESTING): HIV: NONREACTIVE

## 2011-06-21 LAB — OB RESULTS CONSOLE HEPATITIS B SURFACE ANTIGEN: Hepatitis B Surface Ag: NEGATIVE

## 2011-06-21 LAB — OB RESULTS CONSOLE GC/CHLAMYDIA: Gonorrhea: NEGATIVE

## 2011-08-10 DIAGNOSIS — Z3490 Encounter for supervision of normal pregnancy, unspecified, unspecified trimester: Secondary | ICD-10-CM

## 2011-09-01 LAB — OB RESULTS CONSOLE GBS: GBS: POSITIVE

## 2011-09-28 ENCOUNTER — Telehealth (HOSPITAL_COMMUNITY): Payer: Self-pay | Admitting: *Deleted

## 2011-09-28 ENCOUNTER — Encounter (HOSPITAL_COMMUNITY): Payer: Self-pay | Admitting: *Deleted

## 2011-09-28 ENCOUNTER — Other Ambulatory Visit (HOSPITAL_COMMUNITY): Payer: Self-pay | Admitting: Physician Assistant

## 2011-09-28 DIAGNOSIS — O480 Post-term pregnancy: Secondary | ICD-10-CM

## 2011-09-28 NOTE — Telephone Encounter (Signed)
Preadmission screen  

## 2011-10-02 ENCOUNTER — Ambulatory Visit (HOSPITAL_COMMUNITY): Admission: RE | Admit: 2011-10-02 | Payer: Medicaid Other | Source: Ambulatory Visit

## 2011-10-04 ENCOUNTER — Inpatient Hospital Stay (HOSPITAL_COMMUNITY)
Admission: AD | Admit: 2011-10-04 | Discharge: 2011-10-07 | DRG: 767 | Disposition: A | Discharge status: Home / Self Care | Payer: Medicaid Other | Source: Ambulatory Visit | Attending: Obstetrics and Gynecology | Admitting: Obstetrics and Gynecology

## 2011-10-04 ENCOUNTER — Encounter (HOSPITAL_COMMUNITY): Payer: Self-pay | Admitting: *Deleted

## 2011-10-04 DIAGNOSIS — Z3490 Encounter for supervision of normal pregnancy, unspecified, unspecified trimester: Secondary | ICD-10-CM

## 2011-10-04 DIAGNOSIS — IMO0001 Reserved for inherently not codable concepts without codable children: Secondary | ICD-10-CM

## 2011-10-04 DIAGNOSIS — O99892 Other specified diseases and conditions complicating childbirth: Principal | ICD-10-CM | POA: Diagnosis present

## 2011-10-04 DIAGNOSIS — Z302 Encounter for sterilization: Secondary | ICD-10-CM

## 2011-10-04 DIAGNOSIS — Z22330 Carrier of Group B streptococcus: Secondary | ICD-10-CM

## 2011-10-04 LAB — CBC
HCT: 32.4 % — ABNORMAL LOW (ref 36.0–46.0)
Hemoglobin: 10.5 g/dL — ABNORMAL LOW (ref 12.0–15.0)
RBC: 3.44 MIL/uL — ABNORMAL LOW (ref 3.87–5.11)

## 2011-10-04 MED ORDER — HYDROXYZINE HCL 50 MG/ML IM SOLN
50.0000 mg | Freq: Four times a day (QID) | INTRAMUSCULAR | Status: DC | PRN
  Filled 2011-10-04: qty 1

## 2011-10-04 MED ORDER — ONDANSETRON HCL 4 MG/2ML IJ SOLN: 4.0000 mg | Freq: Four times a day (QID) | INTRAMUSCULAR | Status: DC | PRN

## 2011-10-04 MED ORDER — LACTATED RINGERS IV SOLN: 500.0000 mL | INTRAVENOUS | Status: DC | PRN

## 2011-10-04 MED ORDER — OXYTOCIN 40 UNITS IN LACTATED RINGERS INFUSION - SIMPLE MED
62.5000 mL/h | Freq: Once | INTRAVENOUS | Status: DC
  Filled 2011-10-04: qty 1000

## 2011-10-04 MED ORDER — ACETAMINOPHEN 325 MG PO TABS: 650.0000 mg | ORAL_TABLET | ORAL | Status: DC | PRN

## 2011-10-04 MED ORDER — IBUPROFEN 600 MG PO TABS
600.0000 mg | ORAL_TABLET | Freq: Four times a day (QID) | ORAL | Status: DC | PRN
  Administered 2011-10-05: 600 mg via ORAL
  Filled 2011-10-04: qty 1

## 2011-10-04 MED ORDER — SODIUM CHLORIDE 0.9 % IV SOLN
2.0000 g | Freq: Once | INTRAVENOUS | Status: DC
  Filled 2011-10-04: qty 2000

## 2011-10-04 MED ORDER — LIDOCAINE HCL (PF) 1 % IJ SOLN
30.0000 mL | INTRAMUSCULAR | Status: DC | PRN
  Filled 2011-10-04: qty 30

## 2011-10-04 MED ORDER — OXYCODONE-ACETAMINOPHEN 5-325 MG PO TABS
1.0000 | ORAL_TABLET | ORAL | Status: DC | PRN
  Administered 2011-10-05: 2 via ORAL
  Filled 2011-10-04: qty 2

## 2011-10-04 MED ORDER — HYDROXYZINE HCL 50 MG PO TABS: 50.0000 mg | ORAL_TABLET | Freq: Four times a day (QID) | ORAL | Status: DC | PRN

## 2011-10-04 MED ORDER — FENTANYL CITRATE 0.05 MG/ML IJ SOLN
50.0000 ug | INTRAMUSCULAR | Status: DC | PRN
  Administered 2011-10-05 (×2): 50 ug via INTRAVENOUS
  Filled 2011-10-04 (×2): qty 2

## 2011-10-04 MED ORDER — LACTATED RINGERS IV SOLN
INTRAVENOUS | Status: DC
  Administered 2011-10-04: 23:00:00 via INTRAVENOUS

## 2011-10-04 MED ORDER — CITRIC ACID-SODIUM CITRATE 334-500 MG/5ML PO SOLN: 30.0000 mL | ORAL | Status: DC | PRN

## 2011-10-04 MED ORDER — OXYTOCIN BOLUS FROM INFUSION
500.0000 mL | Freq: Once | INTRAVENOUS | Status: DC
  Filled 2011-10-04: qty 500

## 2011-10-04 NOTE — MAU Note (Signed)
Pt G5 P3 at 40.6wks, contracting since last night and leaking small amt of fluid at 1845.  GBS +, HX MRSA.  Prev C/S x 1, plans for vag del.

## 2011-10-04 NOTE — H&P (Signed)
  Alicia Branch is a 21 y.o. female [phone] with IUP at [redacted]w[redacted]d presenting for contractions, ?ROM. Pt states she has been having regular, every 3 minutes contractions, associated with none vaginal bleeding, membranes are ? ruptured, with active fetal movement.   [facility] at [facility] since 20 wks  Prenatal History/Complications: Late care Past Medical History: Past Medical History  Diagnosis Date  . Anemia     Past Surgical History: Past Surgical History  Procedure Date  . Cesarean section     Obstetrical History: OB History    Grav Para Term Preterm Abortions TAB SAB Ect Mult Living   5 4 4  1 1    4       Gynecological History: OB History    Grav Para Term Preterm Abortions TAB SAB Ect Mult Living   5 4 4  1 1    4      Had C/S for spina bifida first pregnancy; 2 VBACs Social History: History   Social History  . Marital Status: Single    Spouse Name: N/A    Number of Children: N/A  . Years of Education: N/A   Social History Main Topics  . Smoking status: Former Smoker -- 0.5 packs/day    Quit date: 05/01/2011  . Smokeless tobacco: None  . Alcohol Use: No  . Drug Use: No  . Sexually Active: Yes    Birth Control/ Protection: None   Other Topics Concern  . None   Social History Narrative  . None    Family History: Family History  Problem Relation Age of Onset  . Hypertension Mother   . Diabetes Maternal Grandfather     Allergies: No Known Allergies  Prescriptions prior to admission  Medication Sig Dispense Refill  . Prenatal Vit-Fe Fumarate-FA (PRENATAL MULTIVITAMIN) TABS Take 1 tablet by mouth every morning.        Review of Systems - Negative   Blood pressure 110/66, pulse 73, temperature 98.3 F (36.8 C), temperature source Oral, resp. rate 18, height 5\' 2"  (1.575 m), weight 167 lb 9.6 oz (76.023 kg), last menstrual period 01/18/2011, SpO2 97.00%, unknown if currently breastfeeding. General appearance: alert, cooperative and no distress Lungs:  clear to auscultation bilaterally Heart: regular rate and rhythm Abdomen: soft, non-tender; bowel sounds normal Pelvic: normal appearing d/c; BOW seen, no pooling Extremities: Homans sign is negative, no sign of DVT DTR's 2+ Presentation: cephalic Fetal monitoringBaseline: 140 bpm, Variability: Good {> 6 bpm), Accelerations: Reactive and Decelerations: Absent Uterine activity mod contractions q 3 minutes Dilation: 10 Effacement (%): 100 Station: +2 Exam by:: cresenzo-dish   Prenatal labs: ABO, Rh: A/Positive/-- (04/18 0000) Antibody: Negative (05/29 0000) Rubella:  immune RPR: NON REACTIVE (09/11 2330)  HBsAg: Negative (05/29 0000)  HIV: Non-reactive (05/29 0000)  GBS: Positive (08/09 0000)  1 hr Glucola 70 Genetic screening  Too late Anatomy US normal x echogenic focus LV    Assessment: Alicia Branch is a 21 y.o. [phone] with an IUP at [redacted]w[redacted]d presenting for active labor  Plan: Expectant management; provide GBS prophylaxis   BOSWELL, LAURA G 10/05/2011, 11:25 AM

## 2011-10-05 ENCOUNTER — Encounter (HOSPITAL_COMMUNITY): Payer: Self-pay | Admitting: Anesthesiology

## 2011-10-05 ENCOUNTER — Inpatient Hospital Stay (HOSPITAL_COMMUNITY): Payer: Medicaid Other | Admitting: Anesthesiology

## 2011-10-05 ENCOUNTER — Encounter (HOSPITAL_COMMUNITY): Payer: Self-pay | Admitting: Advanced Practice Midwife

## 2011-10-05 ENCOUNTER — Encounter (HOSPITAL_COMMUNITY)
Admission: AD | Disposition: A | Discharge status: Home / Self Care | Payer: Self-pay | Source: Ambulatory Visit | Attending: Obstetrics and Gynecology

## 2011-10-05 DIAGNOSIS — Z302 Encounter for sterilization: Secondary | ICD-10-CM

## 2011-10-05 DIAGNOSIS — Z22330 Carrier of Group B streptococcus: Secondary | ICD-10-CM

## 2011-10-05 DIAGNOSIS — O99892 Other specified diseases and conditions complicating childbirth: Secondary | ICD-10-CM

## 2011-10-05 DIAGNOSIS — O9989 Other specified diseases and conditions complicating pregnancy, childbirth and the puerperium: Secondary | ICD-10-CM

## 2011-10-05 HISTORY — PX: TUBAL LIGATION: SHX77

## 2011-10-05 LAB — RPR: RPR Ser Ql: NONREACTIVE

## 2011-10-05 SURGERY — LIGATION, FALLOPIAN TUBE, POSTPARTUM
Anesthesia: General | Site: Abdomen | Laterality: Bilateral | Wound class: Clean Contaminated

## 2011-10-05 MED ORDER — FENTANYL CITRATE 0.05 MG/ML IJ SOLN: 25.0000 ug | INTRAMUSCULAR | Status: DC | PRN

## 2011-10-05 MED ORDER — SENNOSIDES-DOCUSATE SODIUM 8.6-50 MG PO TABS
2.0000 | ORAL_TABLET | Freq: Every day | ORAL | Status: DC
  Administered 2011-10-05 – 2011-10-06 (×2): 2 via ORAL

## 2011-10-05 MED ORDER — PRENATAL MULTIVITAMIN CH
1.0000 | ORAL_TABLET | Freq: Every day | ORAL | Status: DC
  Administered 2011-10-06 – 2011-10-07 (×2): 1 via ORAL
  Filled 2011-10-05 (×3): qty 1

## 2011-10-05 MED ORDER — BENZOCAINE-MENTHOL 20-0.5 % EX AERO
1.0000 "application " | INHALATION_SPRAY | CUTANEOUS | Status: DC | PRN
  Filled 2011-10-05: qty 56

## 2011-10-05 MED ORDER — DIPHENHYDRAMINE HCL 25 MG PO CAPS: 25.0000 mg | ORAL_CAPSULE | Freq: Four times a day (QID) | ORAL | Status: DC | PRN

## 2011-10-05 MED ORDER — FERROUS SULFATE 325 (65 FE) MG PO TABS
325.0000 mg | ORAL_TABLET | Freq: Two times a day (BID) | ORAL | Status: DC
  Administered 2011-10-05 – 2011-10-07 (×4): 325 mg via ORAL
  Filled 2011-10-05 (×4): qty 1

## 2011-10-05 MED ORDER — TETANUS-DIPHTH-ACELL PERTUSSIS 5-2.5-18.5 LF-MCG/0.5 IM SUSP: 0.5000 mL | Freq: Once | INTRAMUSCULAR | Status: DC

## 2011-10-05 MED ORDER — OXYCODONE-ACETAMINOPHEN 5-325 MG PO TABS
1.0000 | ORAL_TABLET | ORAL | Status: DC | PRN
  Administered 2011-10-05 – 2011-10-06 (×5): 1 via ORAL
  Filled 2011-10-05 (×5): qty 1

## 2011-10-05 MED ORDER — MIDAZOLAM HCL 5 MG/5ML IJ SOLN
INTRAMUSCULAR | Status: DC | PRN
  Administered 2011-10-05: 2 mg via INTRAVENOUS

## 2011-10-05 MED ORDER — ONDANSETRON HCL 4 MG/2ML IJ SOLN
INTRAMUSCULAR | Status: DC | PRN
  Administered 2011-10-05: 4 mg via INTRAVENOUS

## 2011-10-05 MED ORDER — LIDOCAINE HCL (CARDIAC) 20 MG/ML IV SOLN
INTRAVENOUS | Status: DC | PRN
  Administered 2011-10-05: 45 mg via INTRAVENOUS

## 2011-10-05 MED ORDER — MEASLES, MUMPS & RUBELLA VAC ~~LOC~~ INJ
0.5000 mL | INJECTION | Freq: Once | SUBCUTANEOUS | Status: DC
  Filled 2011-10-05: qty 0.5

## 2011-10-05 MED ORDER — ZOLPIDEM TARTRATE 5 MG PO TABS: 5.0000 mg | ORAL_TABLET | Freq: Every evening | ORAL | Status: DC | PRN

## 2011-10-05 MED ORDER — DIBUCAINE 1 % RE OINT: 1.0000 "application " | TOPICAL_OINTMENT | RECTAL | Status: DC | PRN

## 2011-10-05 MED ORDER — LACTATED RINGERS IV SOLN
INTRAVENOUS | Status: DC
  Administered 2011-10-05: 15:00:00 via INTRAVENOUS

## 2011-10-05 MED ORDER — WITCH HAZEL-GLYCERIN EX PADS: 1.0000 "application " | MEDICATED_PAD | CUTANEOUS | Status: DC | PRN

## 2011-10-05 MED ORDER — ONDANSETRON HCL 4 MG/2ML IJ SOLN: 4.0000 mg | INTRAMUSCULAR | Status: DC | PRN

## 2011-10-05 MED ORDER — METHYLERGONOVINE MALEATE 0.2 MG PO TABS: 0.2000 mg | ORAL_TABLET | ORAL | Status: DC | PRN

## 2011-10-05 MED ORDER — KETOROLAC TROMETHAMINE 30 MG/ML IJ SOLN: 15.0000 mg | Freq: Once | INTRAMUSCULAR | Status: DC | PRN

## 2011-10-05 MED ORDER — SUCCINYLCHOLINE CHLORIDE 20 MG/ML IJ SOLN
INTRAMUSCULAR | Status: DC | PRN
  Administered 2011-10-05: 120 mg via INTRAVENOUS

## 2011-10-05 MED ORDER — MAGNESIUM HYDROXIDE 400 MG/5ML PO SUSP: 30.0000 mL | ORAL | Status: DC | PRN

## 2011-10-05 MED ORDER — IBUPROFEN 600 MG PO TABS
600.0000 mg | ORAL_TABLET | Freq: Four times a day (QID) | ORAL | Status: DC
  Administered 2011-10-05 – 2011-10-07 (×7): 600 mg via ORAL
  Filled 2011-10-05 (×8): qty 1

## 2011-10-05 MED ORDER — KETOROLAC TROMETHAMINE 30 MG/ML IJ SOLN
INTRAMUSCULAR | Status: DC | PRN
  Administered 2011-10-05: 30 mg via INTRAVENOUS

## 2011-10-05 MED ORDER — FENTANYL CITRATE 0.05 MG/ML IJ SOLN
INTRAMUSCULAR | Status: AC
  Filled 2011-10-05: qty 2

## 2011-10-05 MED ORDER — MEPERIDINE HCL 25 MG/ML IJ SOLN: 6.2500 mg | INTRAMUSCULAR | Status: DC | PRN

## 2011-10-05 MED ORDER — BUPIVACAINE HCL (PF) 0.5 % IJ SOLN
INTRAMUSCULAR | Status: AC
  Filled 2011-10-05: qty 30

## 2011-10-05 MED ORDER — FENTANYL CITRATE 0.05 MG/ML IJ SOLN
INTRAMUSCULAR | Status: DC | PRN
  Administered 2011-10-05 (×2): 50 ug via INTRAVENOUS

## 2011-10-05 MED ORDER — ONDANSETRON HCL 4 MG/2ML IJ SOLN
INTRAMUSCULAR | Status: AC
  Filled 2011-10-05: qty 2

## 2011-10-05 MED ORDER — MIDAZOLAM HCL 2 MG/2ML IJ SOLN
INTRAMUSCULAR | Status: AC
  Filled 2011-10-05: qty 2

## 2011-10-05 MED ORDER — KETOROLAC TROMETHAMINE 30 MG/ML IJ SOLN
INTRAMUSCULAR | Status: AC
  Filled 2011-10-05: qty 1

## 2011-10-05 MED ORDER — PROPOFOL 10 MG/ML IV EMUL
INTRAVENOUS | Status: AC
  Filled 2011-10-05: qty 20

## 2011-10-05 MED ORDER — METHYLERGONOVINE MALEATE 0.2 MG/ML IJ SOLN: 0.2000 mg | INTRAMUSCULAR | Status: DC | PRN

## 2011-10-05 MED ORDER — DEXAMETHASONE SODIUM PHOSPHATE 10 MG/ML IJ SOLN
INTRAMUSCULAR | Status: AC
  Filled 2011-10-05: qty 1

## 2011-10-05 MED ORDER — ONDANSETRON HCL 4 MG PO TABS: 4.0000 mg | ORAL_TABLET | ORAL | Status: DC | PRN

## 2011-10-05 MED ORDER — LANOLIN HYDROUS EX OINT: TOPICAL_OINTMENT | CUTANEOUS | Status: DC | PRN

## 2011-10-05 MED ORDER — LACTATED RINGERS IV SOLN
INTRAVENOUS | Status: DC | PRN
  Administered 2011-10-05: 13:00:00 via INTRAVENOUS

## 2011-10-05 MED ORDER — LIDOCAINE HCL (CARDIAC) 20 MG/ML IV SOLN
INTRAVENOUS | Status: AC
  Filled 2011-10-05: qty 5

## 2011-10-05 MED ORDER — BUPIVACAINE HCL (PF) 0.5 % IJ SOLN
INTRAMUSCULAR | Status: DC | PRN
  Administered 2011-10-05: 30 mL

## 2011-10-05 MED ORDER — PROPOFOL 10 MG/ML IV BOLUS
INTRAVENOUS | Status: DC | PRN
  Administered 2011-10-05: 50 mg via INTRAVENOUS
  Administered 2011-10-05: 150 mg via INTRAVENOUS

## 2011-10-05 MED ORDER — DEXAMETHASONE SODIUM PHOSPHATE 10 MG/ML IJ SOLN
INTRAMUSCULAR | Status: DC | PRN
  Administered 2011-10-05: 10 mg via INTRAVENOUS

## 2011-10-05 MED ORDER — ONDANSETRON HCL 4 MG/2ML IJ SOLN: 4.0000 mg | Freq: Once | INTRAMUSCULAR | Status: DC | PRN

## 2011-10-05 MED ORDER — SIMETHICONE 80 MG PO CHEW
80.0000 mg | CHEWABLE_TABLET | ORAL | Status: DC | PRN
  Administered 2011-10-05: 80 mg via ORAL

## 2011-10-05 SURGICAL SUPPLY — 27 items
APL SKNCLS STERI-STRIP NONHPOA (GAUZE/BANDAGES/DRESSINGS)
BENZOIN TINCTURE PRP APPL 2/3 (GAUZE/BANDAGES/DRESSINGS) IMPLANT
CHLORAPREP W/TINT 26ML (MISCELLANEOUS) ×2 IMPLANT
CLIP FILSHIE TUBAL LIGA STRL (Clip) ×3 IMPLANT
CLOTH BEACON ORANGE TIMEOUT ST (SAFETY) ×2 IMPLANT
DRSG COVADERM PLUS 2X2 (GAUZE/BANDAGES/DRESSINGS) ×1 IMPLANT
ELECT REM PT RETURN 9FT ADLT (ELECTROSURGICAL)
ELECTRODE REM PT RTRN 9FT ADLT (ELECTROSURGICAL) IMPLANT
GLOVE BIO SURGEON STRL SZ7 (GLOVE) ×2 IMPLANT
GLOVE BIOGEL PI IND STRL 7.0 (GLOVE) ×2 IMPLANT
GLOVE BIOGEL PI INDICATOR 7.0 (GLOVE) ×2
GOWN PREVENTION PLUS LG XLONG (DISPOSABLE) ×4 IMPLANT
NDL HYPO 25X1 1.5 SAFETY (NEEDLE) ×1 IMPLANT
NEEDLE HYPO 25X1 1.5 SAFETY (NEEDLE) ×2 IMPLANT
NS IRRIG 1000ML POUR BTL (IV SOLUTION) ×2 IMPLANT
PACK ABDOMINAL MINOR (CUSTOM PROCEDURE TRAY) ×2 IMPLANT
PENCIL BUTTON HOLSTER BLD 10FT (ELECTRODE) ×1 IMPLANT
SPONGE LAP 4X18 X RAY DECT (DISPOSABLE) IMPLANT
STRIP CLOSURE SKIN 1/2X4 (GAUZE/BANDAGES/DRESSINGS) IMPLANT
SUT CHROMIC 2 0 TIES 18 (SUTURE) IMPLANT
SUT VIC AB 0 CT1 27 (SUTURE) ×2
SUT VIC AB 0 CT1 27XBRD ANBCTR (SUTURE) ×1 IMPLANT
SUT VICRYL 4-0 PS2 18IN ABS (SUTURE) ×2 IMPLANT
SYR CONTROL 10ML LL (SYRINGE) ×2 IMPLANT
TOWEL OR 17X24 6PK STRL BLUE (TOWEL DISPOSABLE) ×4 IMPLANT
TRAY FOLEY CATH 14FR (SET/KITS/TRAYS/PACK) ×2 IMPLANT
WATER STERILE IRR 1000ML POUR (IV SOLUTION) ×2 IMPLANT

## 2011-10-05 NOTE — Progress Notes (Signed)
5 min FHR after ROM

## 2011-10-05 NOTE — Transfer of Care (Signed)
Immediate Anesthesia Transfer of Care Note  Patient: Alicia Branch  Procedure(s) Performed: Procedure(s) (LRB) with comments: POST PARTUM TUBAL LIGATION (Bilateral)  Patient Location: PACU  Anesthesia Type: General  Level of Consciousness: sedated  Airway & Oxygen Therapy: Patient Spontanous Breathing and Patient connected to nasal cannula oxygen  Post-op Assessment: Report given to PACU RN and Post -op Vital signs reviewed and stable  Post vital signs: stable  Complications: No apparent anesthesia complications

## 2011-10-05 NOTE — Progress Notes (Addendum)
Alicia Branch is a 21 y.o. [phone] at [redacted]w[redacted]d admitted in active labor.  Subjective: Pt feeling increasing pain, contracting every 4-5 minutes.  Objective: BP 128/74  Pulse 85  Temp 98.6 F (37 C) (Oral)  Resp 18  Ht 5\' 2"  (1.575 m)  Wt 76.023 kg (167 lb 9.6 oz)  BMI 30.65 kg/m2  LMP 01/18/2011      FHT:  FHR: 130 bpm, variability: moderate,  accelerations:  Present,  decelerations:  Present variable  UC:   regular, every 4-5 minutes SVE:   Dilation: 8 Effacement (%): 100 Station: 0 Exam by:: LCarpenter,RN  Labs: Lab Results  Component Value Date   WBC 9.2 10/04/2011   HGB 10.5* 10/04/2011   HCT 32.4* 10/04/2011   MCV 94.2 10/04/2011   PLT 318 10/04/2011    Assessment / Plan: Spontaneous labor, progressing normally  Labor: Progressing normallyl; AROM'ed with clear fluid Preeclampsia:  n/a Fetal Wellbeing:  Category I Pain Control:  Labor support without medications I/D:  n/a Anticipated MOD:  NSVD  Simone Curia 10/05/2011, 3:48 AM

## 2011-10-05 NOTE — Progress Notes (Signed)
   Alicia Branch is a 21 y.o. [phone] at [redacted]w[redacted]d  admitted for active labor  Subjective: Desires IV meds  Objective: BP 123/73  Pulse 100  Temp 97.8 F (36.6 C) (Oral)  Resp 18  Ht 5\' 2"  (1.575 m)  Wt 76.023 kg (167 lb 9.6 oz)  BMI 30.65 kg/m2  LMP 01/18/2011    FHT:  FHR: 149 bpm, variability: moderate,  accelerations:  Present,  decelerations:  Absent UC:   Difficult to trace, ~ q 5 minutes SVE:   8/100/-2/BBOW Labs: Lab Results  Component Value Date   WBC 9.2 10/04/2011   HGB 10.5* 10/04/2011   HCT 32.4* 10/04/2011   MCV 94.2 10/04/2011   PLT 318 10/04/2011    Assessment / Plan: Spontaneous labor, progressing normally Due to GBS + status, will not intervene until at least 4 hrs s/p ABX  Labor: Progressing normally Fetal Wellbeing:  Category I Pain Control:  Fentanyl Anticipated MOD:  NSVD  CRESENZO-DISHMAN,Deane Melick 10/05/2011, 1:22 AM

## 2011-10-05 NOTE — Anesthesia Postprocedure Evaluation (Signed)
Anesthesia Post Note  Patient: Alicia Branch  Procedure(s) Performed: Procedure(s) (LRB): POST PARTUM TUBAL LIGATION (Bilateral)  Anesthesia type: General  Patient location: PACU  Post pain: Pain level controlled  Post assessment: Post-op Vital signs reviewed  Last Vitals:  Filed Vitals:   10/05/11 1357  BP:   Pulse: 82  Temp:   Resp: 12    Post vital signs: Reviewed  Level of consciousness: sedated  Complications: No apparent anesthesia complicationsfj

## 2011-10-05 NOTE — Progress Notes (Signed)
Attending Progress Note  Alicia Branch [id number] s/p SVD earlier today desires permanent sterilization.  Other reversible forms of contraception were discussed with patient; she declines all other modalities. Risks of procedure discussed with patient including but not limited to: risk of regret, permanence of method, bleeding, infection, injury to surrounding organs and need for additional procedures.  Failure risk of 0.5-1% with increased risk of ectopic gestation if pregnancy occurs was also discussed with patient.  Patient verbalized understanding of these risks and wants to proceed with sterilization.  Written informed consent obtained.  To OR when ready.  Jaynie Collins, MD, FACOG Attending Obstetrician & Gynecologist Faculty Practice, [facility]

## 2011-10-05 NOTE — Op Note (Signed)
Alicia Branch 10/04/2011 - 10/05/2011  PREOPERATIVE DIAGNOSIS:  Multiparity, undesired fertility  POSTOPERATIVE DIAGNOSIS:  Multiparity, undesired fertility  PROCEDURE:  Postpartum Bilateral Tubal Sterilization using Filshie Clips   ANESTHESIA:  Epidural and local analgesia using 0.5% Marcaine  COMPLICATIONS:  None immediate.  ESTIMATED BLOOD LOSS: 5 ml.  FLUIDS: 900 ml LR.  URINE OUTPUT:  200 ml of clear urine.  INDICATIONS: 21 y.o. [id number] with undesired fertility,status post vaginal delivery, desires permanent sterilization.  Other reversible forms of contraception were discussed with patient; she declines all other modalities. Risks of procedure discussed with patient including but not limited to: risk of regret, permanence of method, bleeding, infection, injury to surrounding organs and need for additional procedures.  Failure risk of 0.5-1% with increased risk of ectopic gestation if pregnancy occurs was also discussed with patient.     FINDINGS:  Normal uterus, tubes, and ovaries.  PROCEDURE DETAILS: The patient was taken to the operating room where her epidural anesthesia was dosed up to surgical level and found to be adequate.  She was then placed in the dorsal supine position and prepped and draped in sterile fashion.  After an adequate timeout was performed, attention was turned to the patient's abdomen where a small transverse skin incision was made under the umbilical fold. The incision was taken down to the layer of fascia using the scalpel, and fascia was incised, and extended bilaterally using Mayo scissors. The peritoneum was entered in a sharp fashion. Attention was then turned to the patient's uterus, and left fallopian tube was identified and followed out to the fimbriated end.  A Filshie clip was placed on the left fallopian tube about 3 cm from the cornual attachment, with care given to incorporate the underlying mesosalpinx.  A similar process was carried out on the  right side allowing for bilateral tubal sterilization.  Good hemostasis was noted overall.  Local analgesia was injected into both Filshie application sites, fascia and subcutaneous tissue. The instruments were then removed from the patient's abdomen and the fascial incision was repaired with 0 Vicryl, and the skin was closed with a 4-0 Vicryl subcuticular stitch. The patient tolerated the procedure well.  Instrument, sponge, and needle counts were correct times two.  The patient was then taken to the recovery room awake and in stable condition.

## 2011-10-05 NOTE — Progress Notes (Signed)
UR Chart review completed.  

## 2011-10-05 NOTE — Anesthesia Preprocedure Evaluation (Signed)
Anesthesia Evaluation  Patient identified by MRN, date of birth, ID band Patient awake    Reviewed: Allergy & Precautions, H&P , NPO status , Patient's Chart, lab work & pertinent test results  Airway Mallampati: I TM Distance: >3 FB Neck ROM: full    Dental No notable dental hx. (+) Teeth Intact   Pulmonary neg pulmonary ROS,    Pulmonary exam normal - decreased breath sounds- wheezing      Cardiovascular negative cardio ROS      Neuro/Psych negative neurological ROS  negative psych ROS   GI/Hepatic negative GI ROS, Neg liver ROS,   Endo/Other  negative endocrine ROS  Renal/GU negative Renal ROS  negative genitourinary   Musculoskeletal negative musculoskeletal ROS (+)   Abdominal Normal abdominal exam  (+)   Peds negative pediatric ROS (+)  Hematology negative hematology ROS (+)   Anesthesia Other Findings   Reproductive/Obstetrics negative OB ROS                           Anesthesia Physical Anesthesia Plan  ASA: I  Anesthesia Plan: General   Post-op Pain Management:    Induction: Intravenous  Airway Management Planned: Oral ETT  Additional Equipment:   Intra-op Plan:   Post-operative Plan:   Informed Consent: I have reviewed the patients History and Physical, chart, labs and discussed the procedure including the risks, benefits and alternatives for the proposed anesthesia with the patient or authorized representative who has indicated his/her understanding and acceptance.   Dental Advisory Given  Plan Discussed with: CRNA and Surgeon  Anesthesia Plan Comments:         Anesthesia Quick Evaluation

## 2011-10-06 ENCOUNTER — Encounter (HOSPITAL_COMMUNITY): Payer: Self-pay | Admitting: Obstetrics & Gynecology

## 2011-10-06 LAB — CBC
MCV: 92.8 fL (ref 78.0–100.0)
Platelets: 278 10*3/uL (ref 150–400)
RBC: 2.63 MIL/uL — ABNORMAL LOW (ref 3.87–5.11)
WBC: 13.4 10*3/uL — ABNORMAL HIGH (ref 4.0–10.5)

## 2011-10-06 MED ORDER — INFLUENZA VIRUS VACC SPLIT PF IM SUSP
0.5000 mL | INTRAMUSCULAR | Status: AC
  Administered 2011-10-07: 0.5 mL via INTRAMUSCULAR
  Filled 2011-10-06: qty 0.5

## 2011-10-06 NOTE — Progress Notes (Signed)
I have seen and examined patient and agree with above. Pt frustrated with needing pain medications, irritable. Pain may not be well controlled. Acute blood loss anemia. Monitor for symptoms. Napoleon Form, MD 10/06/2011 9:26 AM

## 2011-10-06 NOTE — Progress Notes (Signed)
Post Partum Day 1. VBAC Subjective: no complaints, up ad lib, voiding and tolerating PO  Objective: Blood pressure 118/69, pulse 85, temperature 97.7 F (36.5 C), temperature source Oral, resp. rate 18, height 5\' 2"  (1.575 m), weight 76.023 kg (167 lb 9.6 oz), last menstrual period 01/18/2011, SpO2 97.00%, unknown if currently breastfeeding.  Physical Exam:  General: alert, cooperative and no distress Lochia: appropriate Uterine Fundus: firm Incision: BTL incision with well applied dressing, no drainage or surrounding erythema  DVT Evaluation: No evidence of DVT seen on physical exam.   Basename 10/06/11 0500 10/04/11 2330  HGB 7.9* 10.5*  HCT 24.4* 32.4*    Assessment/Plan: Contraception BTL Debating on d/c home today vs tomorrow, will let us know this afternoon Bottle feeding   LOS: 2 days   Lowery Paullin 10/06/2011, 7:36 AM

## 2011-10-07 MED ORDER — IBUPROFEN 600 MG PO TABS: 600.0000 mg | ORAL_TABLET | Freq: Four times a day (QID) | ORAL | Status: AC

## 2011-10-07 MED ORDER — FERROUS SULFATE 325 (65 FE) MG PO TABS: 325.0000 mg | ORAL_TABLET | Freq: Two times a day (BID) | ORAL | Status: DC

## 2011-10-07 MED ORDER — SENNOSIDES-DOCUSATE SODIUM 8.6-50 MG PO TABS: 2.0000 | ORAL_TABLET | Freq: Every day | ORAL | Status: DC

## 2011-10-07 NOTE — Discharge Summary (Addendum)
Obstetric Discharge Summary Reason for Admission: onset of labor Prenatal Procedures: ultrasound Intrapartum Procedures: spontaneous vaginal delivery Postpartum Procedures: P.P. tubal ligation Complications-Operative and Postpartum: none Hemoglobin  Date Value Range Status  10/06/2011 7.9* 12.0 - 15.0 g/dL Final     REPEATED TO VERIFY     DELTA CHECK NOTED     HCT  Date Value Range Status  10/06/2011 24.4* 36.0 - 46.0 % Final    Physical Exam:  General: alert, cooperative and no distress Lochia: appropriate Uterine Fundus: firm Incision: no significant drainage, no significant erythema DVT Evaluation: No evidence of DVT seen on physical exam.  Discharge Diagnoses: Term Pregnancy-delivered  Discharge Information: Date: 10/07/2011 Activity: pelvic rest Diet: routine Medications: PNV, Ibuprofen and Colace Condition: stable Instructions: refer to practice specific booklet Discharge to: home   Newborn Data: Live born female  Birth Weight: 7 lb 9.7 oz (3450 g) APGAR: 8, 9  Home with mother.  MCGILL,JACQUELYN 10/07/2011, 7:22 AM

## 2011-10-07 NOTE — Discharge Summary (Signed)
I examined patient and agree with resident's note and plan of care. I also added an Rx for ferrous sulfate 325mg  BID d/t H/H 7.9/24.4 and reviewed w/ pt, pt asymptomatic. Cheral Marker, CNM, WHNP-BC

## 2011-10-08 ENCOUNTER — Inpatient Hospital Stay (HOSPITAL_COMMUNITY): Admission: RE | Admit: 2011-10-08 | Payer: Medicaid Other | Source: Ambulatory Visit

## 2011-10-16 NOTE — Discharge Summary (Signed)
Attestation of Attending Supervision of Advanced Practitioner (CNM/NP): Evaluation and management procedures were performed by the Advanced Practitioner under my supervision and collaboration.  I have reviewed the Advanced Practitioner's note and chart, and I agree with the management and plan.  Andric Kerce 10/16/2011 8:59 AM

## 2012-06-27 ENCOUNTER — Emergency Department (HOSPITAL_COMMUNITY)
Admission: EM | Admit: 2012-06-27 | Discharge: 2012-06-27 | Disposition: A | Discharge status: Home / Self Care | Payer: Medicaid Other | Attending: Emergency Medicine | Admitting: Emergency Medicine

## 2012-06-27 ENCOUNTER — Encounter (HOSPITAL_COMMUNITY): Payer: Self-pay

## 2012-06-27 DIAGNOSIS — Z862 Personal history of diseases of the blood and blood-forming organs and certain disorders involving the immune mechanism: Secondary | ICD-10-CM | POA: Insufficient documentation

## 2012-06-27 DIAGNOSIS — Z79899 Other long term (current) drug therapy: Secondary | ICD-10-CM | POA: Insufficient documentation

## 2012-06-27 DIAGNOSIS — Z87891 Personal history of nicotine dependence: Secondary | ICD-10-CM | POA: Insufficient documentation

## 2012-06-27 DIAGNOSIS — R51 Headache: Secondary | ICD-10-CM | POA: Insufficient documentation

## 2012-06-27 MED ORDER — KETOROLAC TROMETHAMINE 30 MG/ML IJ SOLN
30.0000 mg | Freq: Once | INTRAMUSCULAR | Status: AC
  Administered 2012-06-27: 30 mg via INTRAVENOUS
  Filled 2012-06-27: qty 1

## 2012-06-27 MED ORDER — METOCLOPRAMIDE HCL 5 MG/ML IJ SOLN
10.0000 mg | Freq: Once | INTRAMUSCULAR | Status: AC
Start: 2012-06-27 — End: 2012-06-27
  Administered 2012-06-27: 10 mg via INTRAVENOUS
  Filled 2012-06-27: qty 2

## 2012-06-27 MED ORDER — DIPHENHYDRAMINE HCL 50 MG/ML IJ SOLN
25.0000 mg | Freq: Once | INTRAMUSCULAR | Status: AC
  Administered 2012-06-27: 25 mg via INTRAVENOUS
  Filled 2012-06-27: qty 1

## 2012-06-27 MED ORDER — DEXAMETHASONE SODIUM PHOSPHATE 10 MG/ML IJ SOLN
10.0000 mg | Freq: Once | INTRAMUSCULAR | Status: AC
  Administered 2012-06-27: 10 mg via INTRAVENOUS
  Filled 2012-06-27: qty 1

## 2012-06-27 NOTE — ED Notes (Signed)
Patient c/o headache x3 days. Reports taking ibuprofen, tylenol, and vicodin at home with no relief. Denies personal hx of migraines, but states her mother and brother both have migraines. Patient resting, watching TV in NAD at this time.

## 2012-06-27 NOTE — ED Provider Notes (Signed)
History     CSN: [id number]  Arrival date & time 06/27/12  2051   First MD Initiated Contact with Patient 06/27/12 2137      Chief Complaint  Patient presents with  . Headache   HPI  History provided by the patient. The patient is a 22 year old female with no significant PMH who presents with multiple complaints but primarily of a right-sided headache. She reports having increased frequency of right-sided headaches over the past several weeks to months. They may come at any time. She usually treats headaches with over-the-counter pain medications with improvement. Her headache today has been waxing and waning for the past 2 days. It was gradual on onset. Patient does mention she recently had a left lower molar tooth removed and has been on amoxicillin following this. She has also been taking Vicodin. This has not helped with her headache. She denies any other new medicines. Patient also has significant complaints of long-standing irritation and sore throat for many months to year. She states she occasionally will cough or gag and have small white stones come out of her mouth that are very foul smelling. Her symptoms are more prominent on the right side. Currently her sore throat is mild. She has not had any associated fever, chills or sweats. She does report some loose stools but denies having more than 3 stools a day. No blood or mucus in stools. Occasional abdominal cramping discomforts. Currently she is not having abdominal pain. Denies any other aggravating or alleviating factors. No other associated symptoms.     Past Medical History  Diagnosis Date  . Anemia     Past Surgical History  Procedure Laterality Date  . Cesarean section    . Tubal ligation  10/05/2011    Procedure: POST PARTUM TUBAL LIGATION;  Surgeon: Tereso Newcomer, MD;  Location: [facility];  Service: Gynecology;  Laterality: Bilateral;    Family History  Problem Relation Age of Onset  . Hypertension Mother   .  Diabetes Maternal Grandfather     History  Substance Use Topics  . Smoking status: Former Smoker -- 0.50 packs/day    Quit date: 05/01/2011  . Smokeless tobacco: Not on file  . Alcohol Use: No    OB History   Grav Para Term Preterm Abortions TAB SAB Ect Mult Living   5 4 4  1 1    4       Review of Systems  Constitutional: Negative for fever, chills and diaphoresis.  HENT: Positive for sore throat.   Eyes: Positive for photophobia.  Respiratory: Negative for cough, shortness of breath and stridor.   Cardiovascular: Negative for chest pain.  Gastrointestinal: Positive for abdominal pain and diarrhea. Negative for nausea and vomiting.  Neurological: Positive for headaches. Negative for light-headedness.  All other systems reviewed and are negative.    Allergies  Review of patient's allergies indicates no known allergies.  Home Medications   Current Outpatient Rx  Name  Route  Sig  Dispense  Refill  . acetaminophen (TYLENOL) 500 MG tablet   Oral   Take 1,000 mg by mouth once.         Marland Kitchen amoxicillin (AMOXIL) 500 MG capsule   Oral   Take 500 mg by mouth 3 (three) times daily. Start date:06/25/12         . HYDROcodone-acetaminophen (NORCO/VICODIN) 5-325 MG per tablet   Oral   Take 1 tablet by mouth every 6 (six) hours as needed for pain.         Marland Kitchen  Prenatal Vit-Fe Fumarate-FA (PRENATAL MULTIVITAMIN) TABS   Oral   Take 1 tablet by mouth every morning.           BP 129/74  Pulse 75  Temp(Src) 98.7 F (37.1 C) (Oral)  Resp 16  SpO2 97%  LMP 06/22/2012  Physical Exam  Nursing note and vitals reviewed. Constitutional: She is oriented to person, place, and time. She appears well-developed and well-nourished. No distress.  HENT:  Head: Normocephalic and atraumatic.  Patient has small appearing tonsils which do appear to be somewhat cryptic. There is no tonsilliths present. Uvula midline. Recently extracted left lower second molar. Appears to be healing well.  No signs of concerning dental infection.  Eyes: Conjunctivae and EOM are normal. Pupils are equal, round, and reactive to light.  Neck: Normal range of motion. Neck supple.  No meningeal signs  Cardiovascular: Normal rate and regular rhythm.   No murmur heard. Pulmonary/Chest: Effort normal and breath sounds normal. No respiratory distress. She has no wheezes. She has no rales.  Abdominal: Soft. There is no tenderness. There is no rigidity, no rebound, no guarding, no CVA tenderness and no tenderness at McBurney's point.  Musculoskeletal: Normal range of motion.  Lymphadenopathy:    She has no cervical adenopathy.  Neurological: She is alert and oriented to person, place, and time. She has normal strength. No cranial nerve deficit or sensory deficit. Gait normal.  Skin: Skin is warm and dry. No rash noted.  Psychiatric: She has a normal mood and affect. Her behavior is normal.    ED Course  Procedures      1. Headache       MDM  10:25PM patient seen and evaluated. Patient appears well. She has normal nonfocal neuro exam. No concerning or red flag symptoms.   Patient feeling better after her headache. I will discharge home. We'll give ENT referral for her, most likely tonsilliths condition.     Angus Seller, PA-C 06/27/12 2321

## 2012-06-27 NOTE — ED Notes (Signed)
Pt had teeth pulled Monday.  Also c/o sore throat, diarrhea, headache, abdominal pain.  Multiple symptoms.  Throat has been there for a while.

## 2012-06-29 NOTE — ED Provider Notes (Signed)
Medical screening examination/treatment/procedure(s) were performed by non-physician practitioner and as supervising physician I was immediately available for consultation/collaboration.  Donnetta Hutching, MD 06/29/12 [id number]

## 2012-08-27 ENCOUNTER — Encounter: Payer: Self-pay | Admitting: *Deleted

## 2013-10-03 ENCOUNTER — Inpatient Hospital Stay (HOSPITAL_COMMUNITY)
Admission: AD | Admit: 2013-10-03 | Discharge: 2013-10-03 | Disposition: A | Discharge status: Home / Self Care | Payer: Medicaid Other | Source: Ambulatory Visit | Attending: Obstetrics and Gynecology | Admitting: Obstetrics and Gynecology

## 2013-10-03 ENCOUNTER — Encounter (HOSPITAL_COMMUNITY): Payer: Self-pay

## 2013-10-03 DIAGNOSIS — N760 Acute vaginitis: Secondary | ICD-10-CM | POA: Insufficient documentation

## 2013-10-03 DIAGNOSIS — R109 Unspecified abdominal pain: Secondary | ICD-10-CM | POA: Diagnosis present

## 2013-10-03 DIAGNOSIS — A499 Bacterial infection, unspecified: Secondary | ICD-10-CM | POA: Diagnosis not present

## 2013-10-03 DIAGNOSIS — B9689 Other specified bacterial agents as the cause of diseases classified elsewhere: Secondary | ICD-10-CM | POA: Insufficient documentation

## 2013-10-03 DIAGNOSIS — Z87891 Personal history of nicotine dependence: Secondary | ICD-10-CM | POA: Diagnosis not present

## 2013-10-03 LAB — URINALYSIS, ROUTINE W REFLEX MICROSCOPIC
Bilirubin Urine: NEGATIVE
GLUCOSE, UA: NEGATIVE mg/dL
HGB URINE DIPSTICK: NEGATIVE
Ketones, ur: NEGATIVE mg/dL
Leukocytes, UA: NEGATIVE
Nitrite: NEGATIVE
PH: 6 (ref 5.0–8.0)
Protein, ur: NEGATIVE mg/dL
SPECIFIC GRAVITY, URINE: 1.02 (ref 1.005–1.030)
Urobilinogen, UA: 0.2 mg/dL (ref 0.0–1.0)

## 2013-10-03 LAB — RPR

## 2013-10-03 LAB — WET PREP, GENITAL
Trich, Wet Prep: NONE SEEN
Yeast Wet Prep HPF POC: NONE SEEN

## 2013-10-03 LAB — POCT PREGNANCY, URINE: Preg Test, Ur: NEGATIVE

## 2013-10-03 LAB — HIV ANTIBODY (ROUTINE TESTING W REFLEX): HIV 1&2 Ab, 4th Generation: NONREACTIVE

## 2013-10-03 MED ORDER — METRONIDAZOLE 500 MG PO TABS: 500.0000 mg | ORAL_TABLET | Freq: Two times a day (BID) | ORAL | Status: DC

## 2013-10-03 NOTE — MAU Note (Signed)
Pt states yellowish white odorous vaginal discharge noted x1 week. Also having pain near where her tubes were tied that is intermittent and sharp.

## 2013-10-03 NOTE — MAU Provider Note (Signed)
CSN: [id number]     Arrival date & time 10/03/13  1023 History   None    Chief Complaint  Patient presents with  . Abdominal Pain  . Vaginal Discharge     (Consider location/radiation/quality/duration/timing/severity/associated sxs/prior Treatment) Patient is a 23 y.o. female presenting with abdominal pain and vaginal discharge. The history is provided by the patient.  Abdominal Pain The primary symptoms of the illness include abdominal pain, diarrhea, dysuria and vaginal discharge. The primary symptoms of the illness do not include fever, shortness of breath, nausea or vomiting. The current episode started more than 2 days ago. The onset of the illness was gradual. The problem has been gradually worsening.  The dysuria is associated with frequency and urgency.   The vaginal discharge is associated with dysuria.  The patient states that she believes she is currently not pregnant. The patient has had a change in bowel habit. Additional symptoms associated with the illness include urgency, frequency and back pain. Symptoms associated with the illness do not include chills.  Vaginal Discharge Associated symptoms include abdominal pain. Pertinent negatives include no chest pain, chills, coughing, fever, nausea, rash or vomiting.   Alicia Branch is a 23 y.o. [id number] who has had a BTL for birth control and uses condoms for protection. She reports that over a week ago while having sex the condom came off and after that her symptoms started. She describes the abdominal pain as cramping. The vaginal discharge as thick yellow and she has vaginal itching. Current sex partner x 3 months. No hx of STI's.   Past Medical History  Diagnosis Date  . Anemia    Past Surgical History  Procedure Laterality Date  . Cesarean section    . Tubal ligation  10/05/2011    Procedure: POST PARTUM TUBAL LIGATION;  Surgeon: Tereso Newcomer, MD;  Location: [facility];  Service: Gynecology;  Laterality: Bilateral;    Family History  Problem Relation Age of Onset  . Hypertension Mother   . Diabetes Maternal Grandfather    History  Substance Use Topics  . Smoking status: Former Smoker -- 0.50 packs/day    Quit date: 05/01/2011  . Smokeless tobacco: Never Used  . Alcohol Use: No   OB History   Grav Para Term Preterm Abortions TAB SAB Ect Mult Living   Review of Systems  Constitutional: Negative for fever and chills.  HENT: Negative.   Eyes: Negative for redness, itching and visual disturbance.  Respiratory: Negative for cough, chest tightness and shortness of breath.   Cardiovascular: Negative for chest pain.  Gastrointestinal: Positive for abdominal pain and diarrhea. Negative for nausea and vomiting.  Genitourinary: Positive for dysuria, urgency, frequency and vaginal discharge.  Musculoskeletal: Positive for back pain.  Skin: Negative for rash.  Neurological: Negative for syncope and light-headedness.  Psychiatric/Behavioral: Negative for confusion. The patient is not nervous/anxious.       Allergies  Review of patient's allergies indicates no known allergies.  Home Medications   Prior to Admission medications   Not on File   BP 109/68  Pulse 66  Temp(Src) 98.3 F (36.8 C) (Oral)  Resp 18  Ht  (1.549 m)  Wt 133 lb 6.4 oz (60.51 kg)  BMI 25.22 kg/m2  LMP 10/01/2013  Breastfeeding? No Physical Exam  Nursing note and vitals reviewed. Constitutional: She is oriented to person, place, and time. She appears well-developed and well-nourished.  HENT:  Head: Normocephalic.  Eyes: EOM are normal.  Neck: Neck supple.  Cardiovascular: Normal rate.   Pulmonary/Chest: Effort normal.  Abdominal: Soft. There is no tenderness.  Genitourinary:  External genitalia without lesions. Frothy malodorous discharge vaginal vault. No CMT, no adnexal tenderness, uterus without palpable enlargement.   Musculoskeletal: Normal range of motion.  Neurological: She is  alert and oriented to person, place, and time. No cranial nerve deficit.  Skin: Skin is warm and dry.  Psychiatric: She has a normal mood and affect. Her behavior is normal.    ED Course  Procedures (including critical care time) Labs Review Results for orders placed during the hospital encounter of 10/03/13 (from the past 24 hour(s))  URINALYSIS, ROUTINE W REFLEX MICROSCOPIC     Status: None   Collection Time    10/03/13 10:43 AM      Result Value Ref Range   Color, Urine YELLOW  YELLOW   APPearance CLEAR  CLEAR   Specific Gravity, Urine 1.020  1.005 - 1.030   pH 6.0  5.0 - 8.0   Glucose, UA NEGATIVE  NEGATIVE mg/dL   Hgb urine dipstick NEGATIVE  NEGATIVE   Bilirubin Urine NEGATIVE  NEGATIVE   Ketones, ur NEGATIVE  NEGATIVE mg/dL   Protein, ur NEGATIVE  NEGATIVE mg/dL   Urobilinogen, UA 0.2  0.0 - 1.0 mg/dL   Nitrite NEGATIVE  NEGATIVE   Leukocytes, UA NEGATIVE  NEGATIVE  POCT PREGNANCY, URINE     Status: None   Collection Time    10/03/13 11:27 AM      Result Value Ref Range   Preg Test, Ur NEGATIVE  NEGATIVE  WET PREP, GENITAL     Status: Abnormal   Collection Time    10/03/13  1:07 PM      Result Value Ref Range   Yeast Wet Prep HPF POC NONE SEEN  NONE SEEN   Trich, Wet Prep NONE SEEN  NONE SEEN   Clue Cells Wet Prep HPF POC MODERATE (*) NONE SEEN   WBC, Wet Prep HPF POC FEW (*) NONE SEEN    MDM  23 y.o. female with vaginal discharge with odor and concern for STI's. Will treat for BV and discussed with the patient cultures and blood work pending and will call only for positive results. Patient voices understanding.    Medication List         metroNIDAZOLE 500 MG tablet  Commonly known as:  FLAGYL  Take 1 tablet (500 mg total) by mouth 2 (two) times daily.

## 2013-10-03 NOTE — MAU Note (Signed)
Pt C/O sharp mid-abd pain for the past week, C/O yellowish-white vag discharge with foul odor.

## 2013-10-04 LAB — GC/CHLAMYDIA PROBE AMP
CT PROBE, AMP APTIMA: NEGATIVE
GC PROBE AMP APTIMA: POSITIVE — AB

## 2013-10-06 ENCOUNTER — Telehealth: Payer: Self-pay | Admitting: *Deleted

## 2013-10-06 DIAGNOSIS — A749 Chlamydial infection, unspecified: Secondary | ICD-10-CM

## 2013-10-06 MED ORDER — AZITHROMYCIN 250 MG PO TABS: 250.0000 mg | ORAL_TABLET | Freq: Once | ORAL | Status: DC

## 2013-10-06 NOTE — Telephone Encounter (Signed)
Message copied by Gerome Apley on Mon Oct 06, 2013 10:24 AM ------      Message from: CONSTANT, PEGGY      Created: Mon Oct 06, 2013  9:42 AM       Please inform patient of positive chlamydia infection. Please call in Rx azithromycin 1 gm po x 1. Partner needs to be informed and treated as well.            Thanks            Clinical cytogeneticist ------

## 2013-10-06 NOTE — Telephone Encounter (Addendum)
Called Daliah and notified of postitive chlamydia and need for treatment, prescription sent to pharmacy of her choice. All questions answered and notified partner needs treatment and they need to refrain from any sexual contact until 2 weeks after last person treated. Josey voices understanding.   Report to Health Dept filled out and sent.

## 2013-10-07 ENCOUNTER — Telehealth (HOSPITAL_COMMUNITY): Payer: Self-pay | Admitting: Nurse Practitioner

## 2013-10-07 NOTE — MAU Provider Note (Signed)
Attestation of Attending Supervision of Advanced Practitioner (CNM/NP): Evaluation and management procedures were performed by the Advanced Practitioner under my supervision and collaboration.  I have reviewed the Advanced Practitioner's note and chart, and I agree with the management and plan.  Mariana Wiederholt 10/07/2013 9:32 AM   

## 2013-10-07 NOTE — Telephone Encounter (Signed)
Telephone call to patient regarding positive GC culture, patient notified.  Transferred patient to [facility] clinic to schedule time to come in for treatment.  Instructed patient to notify her partner for treatment.  Patient has requested to have a mouth swab test as she is concerned that she may have passed this to her children when kissing them good night.  Report faxed to health department.

## 2013-10-07 NOTE — Telephone Encounter (Signed)
Patient called and left message that she got disconnected but was trying to make an appt to come in for a shot. Called patient and she states she can come in tomorrow 9/16 @ 930 for her injection. Patient had no other questions

## 2013-10-08 ENCOUNTER — Ambulatory Visit (INDEPENDENT_AMBULATORY_CARE_PROVIDER_SITE_OTHER): Payer: Medicaid Other | Admitting: *Deleted

## 2013-10-08 DIAGNOSIS — A5400 Gonococcal infection of lower genitourinary tract, unspecified: Secondary | ICD-10-CM

## 2013-10-08 DIAGNOSIS — A549 Gonococcal infection, unspecified: Secondary | ICD-10-CM

## 2013-10-08 MED ORDER — CEFTRIAXONE SODIUM 250 MG IJ SOLR
250.0000 mg | Freq: Once | INTRAMUSCULAR | Status: AC
  Administered 2013-10-08: 250 mg via INTRAMUSCULAR

## 2013-10-08 NOTE — Progress Notes (Signed)
Patient has already taken the Azithromycin 1g. She was treated today with Rocephin. Patient tolerated well. Advised to abstain from intercourse until her partner was treated as well. Patient voiced understanding.

## 2013-11-24 ENCOUNTER — Encounter (HOSPITAL_COMMUNITY): Payer: Self-pay

## 2014-03-03 ENCOUNTER — Inpatient Hospital Stay (HOSPITAL_COMMUNITY)
Admission: AD | Admit: 2014-03-03 | Discharge: 2014-03-03 | Disposition: A | Discharge status: Home / Self Care | Payer: Medicaid Other | Source: Ambulatory Visit | Attending: Family Medicine | Admitting: Family Medicine

## 2014-03-03 ENCOUNTER — Encounter (HOSPITAL_COMMUNITY): Payer: Self-pay | Admitting: General Practice

## 2014-03-03 DIAGNOSIS — R197 Diarrhea, unspecified: Secondary | ICD-10-CM | POA: Diagnosis not present

## 2014-03-03 DIAGNOSIS — N760 Acute vaginitis: Secondary | ICD-10-CM | POA: Diagnosis not present

## 2014-03-03 DIAGNOSIS — B9689 Other specified bacterial agents as the cause of diseases classified elsewhere: Secondary | ICD-10-CM | POA: Diagnosis not present

## 2014-03-03 DIAGNOSIS — T148XXA Other injury of unspecified body region, initial encounter: Secondary | ICD-10-CM

## 2014-03-03 DIAGNOSIS — Z87891 Personal history of nicotine dependence: Secondary | ICD-10-CM | POA: Diagnosis not present

## 2014-03-03 DIAGNOSIS — N898 Other specified noninflammatory disorders of vagina: Secondary | ICD-10-CM | POA: Diagnosis present

## 2014-03-03 DIAGNOSIS — Z113 Encounter for screening for infections with a predominantly sexual mode of transmission: Secondary | ICD-10-CM | POA: Insufficient documentation

## 2014-03-03 DIAGNOSIS — A499 Bacterial infection, unspecified: Secondary | ICD-10-CM

## 2014-03-03 LAB — URINALYSIS, ROUTINE W REFLEX MICROSCOPIC
Bilirubin Urine: NEGATIVE
Glucose, UA: NEGATIVE mg/dL
Hgb urine dipstick: NEGATIVE
KETONES UR: 15 mg/dL — AB
Leukocytes, UA: NEGATIVE
NITRITE: NEGATIVE
PH: 6 (ref 5.0–8.0)
PROTEIN: NEGATIVE mg/dL
Specific Gravity, Urine: >1.03 — ABNORMAL HIGH (ref 1.005–1.030)
Urobilinogen, UA: 0.2 mg/dL (ref 0.0–1.0)

## 2014-03-03 LAB — WET PREP, GENITAL
Trich, Wet Prep: NONE SEEN
Yeast Wet Prep HPF POC: NONE SEEN

## 2014-03-03 LAB — CBC
HCT: 37 % (ref 36.0–46.0)
Hemoglobin: 12.1 g/dL (ref 12.0–15.0)
MCH: 31 pg (ref 26.0–34.0)
MCHC: 32.7 g/dL (ref 30.0–36.0)
MCV: 94.9 fL (ref 78.0–100.0)
Platelets: 390 10*3/uL (ref 150–400)
RBC: 3.9 MIL/uL (ref 3.87–5.11)
RDW: 13.3 % (ref 11.5–15.5)
WBC: 10.9 10*3/uL — ABNORMAL HIGH (ref 4.0–10.5)

## 2014-03-03 LAB — POCT PREGNANCY, URINE: PREG TEST UR: NEGATIVE

## 2014-03-03 MED ORDER — METRONIDAZOLE 500 MG PO TABS: 500.0000 mg | ORAL_TABLET | Freq: Two times a day (BID) | ORAL | Status: AC

## 2014-03-03 MED ORDER — ACETAMINOPHEN 500 MG PO TABS
500.0000 mg | ORAL_TABLET | Freq: Once | ORAL | Status: AC
  Administered 2014-03-03: 500 mg via ORAL
  Filled 2014-03-03: qty 1

## 2014-03-03 NOTE — Discharge Instructions (Signed)
Bacterial Vaginosis Bacterial vaginosis is a vaginal infection that occurs when the normal balance of bacteria in the vagina is disrupted. It results from an overgrowth of certain bacteria. This is the most common vaginal infection in women of childbearing age. Treatment is important to prevent complications, especially in pregnant women, as it can cause a premature delivery. CAUSES  Bacterial vaginosis is caused by an increase in harmful bacteria that are normally present in smaller amounts in the vagina. Several different kinds of bacteria can cause bacterial vaginosis. However, the reason that the condition develops is not fully understood. RISK FACTORS Certain activities or behaviors can put you at an increased risk of developing bacterial vaginosis, including:  Having a new sex partner or multiple sex partners.  Douching.  Using an intrauterine device (IUD) for contraception. Women do not get bacterial vaginosis from toilet seats, bedding, swimming pools, or contact with objects around them. SIGNS AND SYMPTOMS  Some women with bacterial vaginosis have no signs or symptoms. Common symptoms include:  Grey vaginal discharge.  A fishlike odor with discharge, especially after sexual intercourse.  Itching or burning of the vagina and vulva.  Burning or pain with urination. DIAGNOSIS  Your health care provider will take a medical history and examine the vagina for signs of bacterial vaginosis. A sample of vaginal fluid may be taken. Your health care provider will look at this sample under a microscope to check for bacteria and abnormal cells. A vaginal pH test may also be done.  TREATMENT  Bacterial vaginosis may be treated with antibiotic medicines. These may be given in the form of a pill or a vaginal cream. A second round of antibiotics may be prescribed if the condition comes back after treatment.  HOME CARE INSTRUCTIONS   Only take over-the-counter or prescription medicines as  directed by your health care provider.  If antibiotic medicine was prescribed, take it as directed. Make sure you finish it even if you start to feel better.  Do not have sex until treatment is completed.  Tell all sexual partners that you have a vaginal infection. They should see their health care provider and be treated if they have problems, such as a mild rash or itching.  Practice safe sex by using condoms and only having one sex partner. SEEK MEDICAL CARE IF:   Your symptoms are not improving after 3 days of treatment.  You have increased discharge or pain.  You have a fever. MAKE SURE YOU:   Understand these instructions.  Will watch your condition.  Will get help right away if you are not doing well or get worse. FOR MORE INFORMATION  Centers for Disease Control and Prevention, Division of STD Prevention: www.cdc.gov/std American Sexual Health Association (ASHA): www.ashastd.org  Document Released: 01/09/2005 Document Revised: 10/30/2012 Document Reviewed: 08/21/2012 ExitCare Patient Information 2015 [employer]. This information is not intended to replace advice given to you by your health care provider. Make sure you discuss any questions you have with your health care provider. Bacterial Vaginosis Bacterial vaginosis is a vaginal infection that occurs when the normal balance of bacteria in the vagina is disrupted. It results from an overgrowth of certain bacteria. This is the most common vaginal infection in women of childbearing age. Treatment is important to prevent complications, especially in pregnant women, as it can cause a premature delivery. CAUSES  Bacterial vaginosis is caused by an increase in harmful bacteria that are normally present in smaller amounts in the vagina. Several different kinds of bacteria   can cause bacterial vaginosis. However, the reason that the condition develops is not fully understood. RISK FACTORS Certain activities or behaviors can put  you at an increased risk of developing bacterial vaginosis, including:  Having a new sex partner or multiple sex partners.  Douching.  Using an intrauterine device (IUD) for contraception. Women do not get bacterial vaginosis from toilet seats, bedding, swimming pools, or contact with objects around them. SIGNS AND SYMPTOMS  Some women with bacterial vaginosis have no signs or symptoms. Common symptoms include:  Grey vaginal discharge.  A fishlike odor with discharge, especially after sexual intercourse.  Itching or burning of the vagina and vulva.  Burning or pain with urination. DIAGNOSIS  Your health care provider will take a medical history and examine the vagina for signs of bacterial vaginosis. A sample of vaginal fluid may be taken. Your health care provider will look at this sample under a microscope to check for bacteria and abnormal cells. A vaginal pH test may also be done.  TREATMENT  Bacterial vaginosis may be treated with antibiotic medicines. These may be given in the form of a pill or a vaginal cream. A second round of antibiotics may be prescribed if the condition comes back after treatment.  HOME CARE INSTRUCTIONS   Only take over-the-counter or prescription medicines as directed by your health care provider.  If antibiotic medicine was prescribed, take it as directed. Make sure you finish it even if you start to feel better.  Do not have sex until treatment is completed.  Tell all sexual partners that you have a vaginal infection. They should see their health care provider and be treated if they have problems, such as a mild rash or itching.  Practice safe sex by using condoms and only having one sex partner. SEEK MEDICAL CARE IF:   Your symptoms are not improving after 3 days of treatment.  You have increased discharge or pain.  You have a fever. MAKE SURE YOU:   Understand these instructions.  Will watch your condition.  Will get help right away if  you are not doing well or get worse. FOR MORE INFORMATION  Centers for Disease Control and Prevention, Division of STD Prevention: www.cdc.gov/std American Sexual Health Association (ASHA): www.ashastd.org  Document Released: 01/09/2005 Document Revised: 10/30/2012 Document Reviewed: 08/21/2012 ExitCare Patient Information 2015 ExitCare[employer]. This information is not intended to replace advice given to you by your health care provider. Make sure you discuss any questions you have with your health care provider.  

## 2014-03-03 NOTE — MAU Provider Note (Signed)
History     CSN: [id number]  Arrival date and time: 03/03/14 4098   None     Chief Complaint  Patient presents with  . Diarrhea  . Vaginal Discharge   HPI  Pt is a 24 y.o. [id number] that reports L rib pain for the last month, sharp when she takes a deep breath. Vaginal discharge with odor & itching. States that vaginal discharge has been in the last 2 weeks.  Diarrhea for the past week and headache. BM this morning was loose. Denies being around anyone with GI illness. No vomiting. 2 diarrhea stools in the last 24 hours, no fever. Just recently stopped smoking 1 week ago and has noticed that she has been coughing more in the past week.    Past Medical History  Diagnosis Date  . Anemia     Past Surgical History  Procedure Laterality Date  . Cesarean section    . Tubal ligation  10/05/2011    Procedure: POST PARTUM TUBAL LIGATION;  Surgeon: Tereso Newcomer, MD;  Location: [facility];  Service: Gynecology;  Laterality: Bilateral;    Family History  Problem Relation Age of Onset  . Hypertension Mother   . Diabetes Maternal Grandfather     History  Substance Use Topics  . Smoking status: Former Smoker -- 0.50 packs/day    Quit date: 02/09/2014  . Smokeless tobacco: Never Used  . Alcohol Use: No    Allergies: No Known Allergies  Prescriptions prior to admission  Medication Sig Dispense Refill Last Dose  . Aspirin-Acetaminophen-Caffeine (GOODY HEADACHE PO) Take 1 Package by mouth 2 (two) times daily as needed (headaches).   Past Week at Unknown time  . azithromycin (ZITHROMAX) 250 MG tablet Take 1 tablet (250 mg total) by mouth once. 4 tablet 0   . metroNIDAZOLE (FLAGYL) 500 MG tablet Take 1 tablet (500 mg total) by mouth 2 (two) times daily. 14 tablet 0     Review of Systems  Constitutional: Negative for fever.  Respiratory: Positive for cough and sputum production. Negative for shortness of breath.   Gastrointestinal: Positive for nausea and diarrhea. Negative for  vomiting, abdominal pain and constipation.  Genitourinary: Positive for dysuria.  All other systems reviewed and are negative.  Physical Exam   Blood pressure 105/76, pulse 98, temperature 97.9 F (36.6 C), temperature source Oral, resp. rate 18, height  (1.549 m), weight 130 lb 2 oz (59.024 kg), last menstrual period 02/20/2014, SpO2 100 %.  Physical Exam  Vitals reviewed. Constitutional: She is oriented to person, place, and time. She appears well-developed and well-nourished. No distress.  HENT:  Head: Normocephalic and atraumatic.  Cardiovascular: Normal rate and regular rhythm.   Respiratory: Effort normal and breath sounds normal. No respiratory distress. She has no wheezes. She has no rales.  Left intercostal tenderness  GI: Bowel sounds are normal. She exhibits no distension and no mass. There is no tenderness. There is no rebound and no guarding.  Genitourinary: Uterus normal. There is no lesion on the right labia. There is no lesion on the left labia. Uterus is not tender. Cervix exhibits no motion tenderness, no discharge and no friability. Right adnexum displays no mass, no tenderness and no fullness. Left adnexum displays no mass and no fullness. No erythema, tenderness or bleeding in the vagina. No signs of injury around the vagina. No vaginal discharge (white cottage cheese discharge) found.  Musculoskeletal: Normal range of motion.  Neurological: She is alert and oriented to person, place, and  time.  Skin: Skin is warm and dry.  Psychiatric: She has a normal mood and affect. Her behavior is normal. Thought content normal.    MAU Course  Procedures Results for orders placed or performed during the hospital encounter of 03/03/14 (from the past 24 hour(s))  Urinalysis, Routine w reflex microscopic     Status: Abnormal   Collection Time: 03/03/14  9:15 AM  Result Value Ref Range   Color, Urine YELLOW YELLOW   APPearance HAZY (A) CLEAR   Specific Gravity, Urine >1.030  (H) 1.005 - 1.030   pH 6.0 5.0 - 8.0   Glucose, UA NEGATIVE NEGATIVE mg/dL   Hgb urine dipstick NEGATIVE NEGATIVE   Bilirubin Urine NEGATIVE NEGATIVE   Ketones, ur 15 (A) NEGATIVE mg/dL   Protein, ur NEGATIVE NEGATIVE mg/dL   Urobilinogen, UA 0.2 0.0 - 1.0 mg/dL   Nitrite NEGATIVE NEGATIVE   Leukocytes, UA NEGATIVE NEGATIVE  Pregnancy, urine POC     Status: None   Collection Time: 03/03/14  9:16 AM  Result Value Ref Range   Preg Test, Ur NEGATIVE NEGATIVE  Wet prep, genital     Status: Abnormal   Collection Time: 03/03/14 10:00 AM  Result Value Ref Range   Yeast Wet Prep HPF POC NONE SEEN NONE SEEN   Trich, Wet Prep NONE SEEN NONE SEEN   Clue Cells Wet Prep HPF POC FEW (A) NONE SEEN   WBC, Wet Prep HPF POC FEW (A) NONE SEEN  CBC     Status: Abnormal   Collection Time: 03/03/14 10:03 AM  Result Value Ref Range   WBC 10.9 (H) 4.0 - 10.5 K/uL   RBC 3.90 3.87 - 5.11 MIL/uL   Hemoglobin 12.1 12.0 - 15.0 g/dL   HCT 16.137.0 09.636.0 - 04.546.0 %   MCV 94.9 78.0 - 100.0 fL   MCH 31.0 26.0 - 34.0 pg   MCHC 32.7 30.0 - 36.0 g/dL   RDW 40.913.3 81.111.5 - 91.415.5 %   Platelets 390 150 - 400 K/uL    Assessment and Plan  A: STD Screening     Bacterial Vaginosis     Diarrhea     Muscle strain P: Tylenol for pain      Flagyl for BV      GC/CT pending      Return to MAU prn.  Clemmons,Lori Grissett 03/03/2014, 9:37 AM

## 2014-03-03 NOTE — MAU Note (Signed)
L rib pain for the last month, sharp when she takes a deep breath.  Vaginal discharge with odor & itching.  Diarrhea for the past week, no vomiting.  2 diarrhea stools in the last 24 hours, no fever.

## 2014-03-04 LAB — GC/CHLAMYDIA PROBE AMP (~~LOC~~) NOT AT ARMC
Chlamydia: NEGATIVE
Neisseria Gonorrhea: NEGATIVE

## 2014-03-04 LAB — HIV ANTIBODY (ROUTINE TESTING W REFLEX): HIV Screen 4th Generation wRfx: NONREACTIVE

## 2014-03-28 ENCOUNTER — Emergency Department (HOSPITAL_COMMUNITY)
Admission: EM | Admit: 2014-03-28 | Discharge: 2014-03-28 | Disposition: A | Discharge status: Home / Self Care | Payer: Medicaid Other | Source: Home / Self Care | Attending: Family Medicine | Admitting: Family Medicine

## 2014-03-28 ENCOUNTER — Encounter (HOSPITAL_COMMUNITY): Payer: Self-pay | Admitting: Emergency Medicine

## 2014-03-28 DIAGNOSIS — J039 Acute tonsillitis, unspecified: Secondary | ICD-10-CM | POA: Diagnosis not present

## 2014-03-28 LAB — POCT INFECTIOUS MONO SCREEN: MONO SCREEN: NEGATIVE

## 2014-03-28 LAB — POCT RAPID STREP A: STREPTOCOCCUS, GROUP A SCREEN (DIRECT): NEGATIVE

## 2014-03-28 MED ORDER — IBUPROFEN 800 MG PO TABS: 800.0000 mg | ORAL_TABLET | Freq: Three times a day (TID) | ORAL | Status: DC

## 2014-03-28 NOTE — Discharge Instructions (Signed)
Use the ibuprofen as needed for pain in his throat or headache, use the sore throat lozenges as needed for sore throat. We will call you if the culture is positive. Please come back if this is not getting any better in 5 days.   Tonsillitis Tonsillitis is an infection of the throat that causes the tonsils to become red, tender, and swollen. Tonsils are collections of lymphoid tissue at the back of the throat. Each tonsil has crevices (crypts). Tonsils help fight nose and throat infections and keep infection from spreading to other parts of the body for the first 18 months of life.  CAUSES Sudden (acute) tonsillitis is usually caused by infection with streptococcal bacteria. Long-lasting (chronic) tonsillitis occurs when the crypts of the tonsils become filled with pieces of food and bacteria, which makes it easy for the tonsils to become repeatedly infected. SYMPTOMS  Symptoms of tonsillitis include:  A sore throat, with possible difficulty swallowing.  White patches on the tonsils.  Fever.  Tiredness.  New episodes of snoring during sleep, when you did not snore before.  Small, foul-smelling, yellowish-white pieces of material (tonsilloliths) that you occasionally cough up or spit out. The tonsilloliths can also cause you to have bad breath. DIAGNOSIS Tonsillitis can be diagnosed through a physical exam. Diagnosis can be confirmed with the results of lab tests, including a throat culture. TREATMENT  The goals of tonsillitis treatment include the reduction of the severity and duration of symptoms and prevention of associated conditions. Symptoms of tonsillitis can be improved with the use of steroids to reduce the swelling. Tonsillitis caused by bacteria can be treated with antibiotic medicines. Usually, treatment with antibiotic medicines is started before the cause of the tonsillitis is known. However, if it is determined that the cause is not bacterial, antibiotic medicines will not treat  the tonsillitis. If attacks of tonsillitis are severe and frequent, your health care provider may recommend surgery to remove the tonsils (tonsillectomy). HOME CARE INSTRUCTIONS   Rest as much as possible and get plenty of sleep.  Drink plenty of fluids. While the throat is very sore, eat soft foods or liquids, such as sherbet, soups, or instant breakfast drinks.  Eat frozen ice pops.  Gargle with a warm or cold liquid to help soothe the throat. Mix 1/4 teaspoon of salt and 1/4 teaspoon of baking soda in 8 oz of water. SEEK MEDICAL CARE IF:   Large, tender lumps develop in your neck.  A rash develops.  A green, yellow-brown, or bloody substance is coughed up.  You are unable to swallow liquids or food for 24 hours.  You notice that only one of the tonsils is swollen. SEEK IMMEDIATE MEDICAL CARE IF:   You develop any new symptoms such as vomiting, severe headache, stiff neck, chest pain, or trouble breathing or swallowing.  You have severe throat pain along with drooling or voice changes.  You have severe pain, unrelieved with recommended medications.  You are unable to fully open the mouth.  You develop redness, swelling, or severe pain anywhere in the neck.  You have a fever. MAKE SURE YOU:   Understand these instructions.  Will watch your condition.  Will get help right away if you are not doing well or get worse. Document Released: 10/19/2004 Document Revised: 05/26/2013 Document Reviewed: 06/28/2012 [facility] Patient Information 2015 [place], [place]. This information is not intended to replace advice given to you by your health care provider. Make sure you discuss any questions you have with your  health care provider. ° °

## 2014-03-28 NOTE — ED Provider Notes (Signed)
CSN: [id number]     Arrival date & time 03/28/14  0905 History   First MD Initiated Contact with Patient 03/28/14 0940     Chief Complaint  Patient presents with  . Sore Throat   (Consider location/radiation/quality/duration/timing/severity/associated sxs/prior Treatment) HPI      24 year old female presents for evaluation of sore throat, headache, and chills. This started last night. Symptoms have been gradually worsening. No measured fever, no cough or NVD. No recent travel or sick contacts. No medications taken for treatment  Past Medical History  Diagnosis Date  . Anemia    Past Surgical History  Procedure Laterality Date  . Cesarean section    . Tubal ligation  10/05/2011    Procedure: POST PARTUM TUBAL LIGATION;  Surgeon: Tereso NewcomerUgonna A Anyanwu, MD;  Location: [facility];  Service: Gynecology;  Laterality: Bilateral;   Family History  Problem Relation Age of Onset  . Hypertension Mother   . Diabetes Maternal Grandfather    History  Substance Use Topics  . Smoking status: Former Smoker -- 0.50 packs/day    Quit date: 02/09/2014  . Smokeless tobacco: Never Used  . Alcohol Use: No   OB History    Gravida Para Term Preterm AB TAB SAB Ectopic Multiple Living   5 4 4  1 1    4      Review of Systems  Constitutional: Positive for chills. Negative for fever.  HENT: Positive for sore throat. Negative for congestion and sinus pressure.   Respiratory: Negative for cough and shortness of breath.   Cardiovascular: Negative for chest pain.  Neurological: Positive for headaches.  All other systems reviewed and are negative.   Allergies  Review of patient's allergies indicates no known allergies.  Home Medications   Prior to Admission medications   Medication Sig Start Date End Date Taking? Authorizing Provider  ibuprofen (ADVIL,MOTRIN) 800 MG tablet Take 1 tablet (800 mg total) by mouth 3 (three) times daily. 03/28/14   Graylon GoodZachary H Bryan Goin, PA-C   BP 112/77 mmHg  Pulse 99  Temp(Src) 99.6  F (37.6 C) (Oral)  Resp 14  SpO2 98%  LMP 03/23/2014 Physical Exam  Constitutional: She is oriented to person, place, and time. Vital signs are normal. She appears well-developed and well-nourished. No distress.  HENT:  Head: Normocephalic and atraumatic.  Right Ear: External ear normal.  Left Ear: External ear normal.  Nose: Nose normal.  Mouth/Throat: Oropharyngeal exudate (minimal tonsillar exudate without erythema or swelling) present.  Eyes: Conjunctivae are normal.  Neck: Normal range of motion. Neck supple.  Cardiovascular: Normal rate, regular rhythm and normal heart sounds.   Pulmonary/Chest: Effort normal and breath sounds normal. No respiratory distress.  Lymphadenopathy:    She has no cervical adenopathy.  Neurological: She is alert and oriented to person, place, and time. She has normal strength. Coordination normal.  Skin: Skin is warm and dry. No rash noted. She is not diaphoretic.  Psychiatric: She has a normal mood and affect. Judgment normal.  Nursing note and vitals reviewed.   ED Course  Procedures (including critical care time) Labs Review Labs Reviewed  POCT RAPID STREP A ([facility] CARE ONLY)  POCT INFECTIOUS MONO SCREEN    Imaging Review No results found.   MDM   1. Tonsillitis    Rapid strep is negative. Mono was negative. Most likely viral, treat symptomatically. Follow-up when necessary    Graylon GoodZachary H Traci Plemons, PA-C 03/28/14 [phone]

## 2014-03-28 NOTE — ED Notes (Signed)
Patient c/o sore throat, fever and chills onset last night. Patient reports she was at a friends house last night and started feeling bad. Patient is in NAD.

## 2014-03-30 LAB — CULTURE, GROUP A STREP: Strep A Culture: POSITIVE — AB

## 2014-03-31 ENCOUNTER — Telehealth (HOSPITAL_COMMUNITY): Payer: Self-pay | Admitting: *Deleted

## 2014-03-31 NOTE — ED Notes (Signed)
Throat culture: Group A strep.  3/7 Message sent to Almedia BallsZach Baker PA.  3/8  Written order from Dr. Artis FlockKindl for Amoxicillin 500 mg. TID #30.  I called pt. Pt. verified x 2 and given result. Pt. told she needs Amoxicillin for strep throat. She wants Rx. called to her pharmacy [facility] on [address].  Rx. called to pharmacist @ [phone]. Alicia MoselleYork, Alicia Branch 03/31/2014

## 2014-06-01 ENCOUNTER — Encounter (HOSPITAL_COMMUNITY): Payer: Self-pay | Admitting: *Deleted

## 2014-06-01 ENCOUNTER — Observation Stay (HOSPITAL_COMMUNITY)
Admission: EM | Admit: 2014-06-01 | Discharge: 2014-06-03 | Disposition: A | Discharge status: Home / Self Care | Payer: Medicaid Other | Attending: Surgery | Admitting: Surgery

## 2014-06-01 DIAGNOSIS — Z9851 Tubal ligation status: Secondary | ICD-10-CM | POA: Diagnosis not present

## 2014-06-01 DIAGNOSIS — K812 Acute cholecystitis with chronic cholecystitis: Secondary | ICD-10-CM | POA: Diagnosis present

## 2014-06-01 DIAGNOSIS — K8010 Calculus of gallbladder with chronic cholecystitis without obstruction: Principal | ICD-10-CM | POA: Insufficient documentation

## 2014-06-01 DIAGNOSIS — F17210 Nicotine dependence, cigarettes, uncomplicated: Secondary | ICD-10-CM | POA: Insufficient documentation

## 2014-06-01 DIAGNOSIS — R109 Unspecified abdominal pain: Secondary | ICD-10-CM | POA: Diagnosis present

## 2014-06-01 DIAGNOSIS — K810 Acute cholecystitis: Secondary | ICD-10-CM

## 2014-06-01 DIAGNOSIS — K8000 Calculus of gallbladder with acute cholecystitis without obstruction: Secondary | ICD-10-CM

## 2014-06-01 DIAGNOSIS — K8020 Calculus of gallbladder without cholecystitis without obstruction: Secondary | ICD-10-CM

## 2014-06-01 LAB — CBC WITH DIFFERENTIAL/PLATELET
BASOS ABS: 0 10*3/uL (ref 0.0–0.1)
Basophils Relative: 0 % (ref 0–1)
EOS ABS: 0.2 10*3/uL (ref 0.0–0.7)
Eosinophils Relative: 2 % (ref 0–5)
HCT: 34 % — ABNORMAL LOW (ref 36.0–46.0)
Hemoglobin: 10.5 g/dL — ABNORMAL LOW (ref 12.0–15.0)
Lymphocytes Relative: 43 % (ref 12–46)
Lymphs Abs: 3.8 10*3/uL (ref 0.7–4.0)
MCH: 29.7 pg (ref 26.0–34.0)
MCHC: 30.9 g/dL (ref 30.0–36.0)
MCV: 96 fL (ref 78.0–100.0)
Monocytes Absolute: 0.4 10*3/uL (ref 0.1–1.0)
Monocytes Relative: 5 % (ref 3–12)
NEUTROS PCT: 50 % (ref 43–77)
Neutro Abs: 4.4 10*3/uL (ref 1.7–7.7)
PLATELETS: 324 10*3/uL (ref 150–400)
RBC: 3.54 MIL/uL — ABNORMAL LOW (ref 3.87–5.11)
RDW: 13.5 % (ref 11.5–15.5)
WBC: 8.9 10*3/uL (ref 4.0–10.5)

## 2014-06-01 LAB — COMPREHENSIVE METABOLIC PANEL
ALK PHOS: 122 U/L (ref 38–126)
ALT: 361 U/L — AB (ref 14–54)
ANION GAP: 5 (ref 5–15)
AST: 103 U/L — ABNORMAL HIGH (ref 15–41)
Albumin: 4 g/dL (ref 3.5–5.0)
BILIRUBIN TOTAL: 0.4 mg/dL (ref 0.3–1.2)
BUN: 10 mg/dL (ref 6–20)
CHLORIDE: 108 mmol/L (ref 101–111)
CO2: 23 mmol/L (ref 22–32)
Calcium: 8.7 mg/dL — ABNORMAL LOW (ref 8.9–10.3)
Creatinine, Ser: 0.67 mg/dL (ref 0.44–1.00)
GFR calc non Af Amer: >60 mL/min (ref >60)
Glucose, Bld: 111 mg/dL — ABNORMAL HIGH (ref 70–99)
Potassium: 3.4 mmol/L — ABNORMAL LOW (ref 3.5–5.1)
Sodium: 136 mmol/L (ref 135–145)
Total Protein: 7.6 g/dL (ref 6.5–8.1)

## 2014-06-01 LAB — URINALYSIS, ROUTINE W REFLEX MICROSCOPIC
BILIRUBIN URINE: NEGATIVE
GLUCOSE, UA: NEGATIVE mg/dL
KETONES UR: NEGATIVE mg/dL
Leukocytes, UA: NEGATIVE
Nitrite: NEGATIVE
Protein, ur: NEGATIVE mg/dL
Specific Gravity, Urine: 1.01 (ref 1.005–1.030)
Urobilinogen, UA: 0.2 mg/dL (ref 0.0–1.0)
pH: 6.5 (ref 5.0–8.0)

## 2014-06-01 LAB — URINE MICROSCOPIC-ADD ON

## 2014-06-01 LAB — LIPASE, BLOOD: LIPASE: 25 U/L (ref 22–51)

## 2014-06-01 NOTE — ED Notes (Signed)
Pt states that she has had upper abd pain x 2 months; pt reports that the pain gets worse at night when she is sleeping; pt states that she vomited last night; pt states that when she belches that it leaves a bad taste; pt also c/o recurrent headaches; pt denies diarrhea

## 2014-06-02 ENCOUNTER — Observation Stay (HOSPITAL_COMMUNITY): Payer: Medicaid Other

## 2014-06-02 ENCOUNTER — Observation Stay (HOSPITAL_COMMUNITY): Payer: Medicaid Other | Admitting: Anesthesiology

## 2014-06-02 ENCOUNTER — Emergency Department (HOSPITAL_COMMUNITY): Payer: Medicaid Other

## 2014-06-02 ENCOUNTER — Encounter (HOSPITAL_COMMUNITY)
Admission: EM | Disposition: A | Discharge status: Home / Self Care | Payer: Self-pay | Source: Home / Self Care | Attending: Emergency Medicine

## 2014-06-02 DIAGNOSIS — K8010 Calculus of gallbladder with chronic cholecystitis without obstruction: Secondary | ICD-10-CM | POA: Diagnosis not present

## 2014-06-02 DIAGNOSIS — F17210 Nicotine dependence, cigarettes, uncomplicated: Secondary | ICD-10-CM | POA: Diagnosis not present

## 2014-06-02 DIAGNOSIS — K812 Acute cholecystitis with chronic cholecystitis: Secondary | ICD-10-CM | POA: Diagnosis present

## 2014-06-02 DIAGNOSIS — K8000 Calculus of gallbladder with acute cholecystitis without obstruction: Secondary | ICD-10-CM

## 2014-06-02 DIAGNOSIS — Z9851 Tubal ligation status: Secondary | ICD-10-CM | POA: Diagnosis not present

## 2014-06-02 HISTORY — PX: CHOLECYSTECTOMY: SHX55

## 2014-06-02 LAB — CBC
HCT: 33.5 % — ABNORMAL LOW (ref 36.0–46.0)
HEMOGLOBIN: 10.8 g/dL — AB (ref 12.0–15.0)
MCH: 31.1 pg (ref 26.0–34.0)
MCHC: 32.2 g/dL (ref 30.0–36.0)
MCV: 96.5 fL (ref 78.0–100.0)
Platelets: 314 10*3/uL (ref 150–400)
RBC: 3.47 MIL/uL — AB (ref 3.87–5.11)
RDW: 13.7 % (ref 11.5–15.5)
WBC: 11.9 10*3/uL — AB (ref 4.0–10.5)

## 2014-06-02 LAB — SURGICAL PCR SCREEN
MRSA, PCR: NEGATIVE
Staphylococcus aureus: NEGATIVE

## 2014-06-02 LAB — CREATININE, SERUM
Creatinine, Ser: 0.51 mg/dL (ref 0.44–1.00)
GFR calc non Af Amer: >60 mL/min (ref >60)

## 2014-06-02 SURGERY — LAPAROSCOPIC CHOLECYSTECTOMY WITH INTRAOPERATIVE CHOLANGIOGRAM
Anesthesia: General | Site: Abdomen

## 2014-06-02 MED ORDER — METRONIDAZOLE IN NACL 5-0.79 MG/ML-% IV SOLN: 500.0000 mg | Freq: Four times a day (QID) | INTRAVENOUS | Status: DC

## 2014-06-02 MED ORDER — DEXTROSE 5 % IV SOLN
INTRAVENOUS | Status: AC
  Filled 2014-06-02: qty 2

## 2014-06-02 MED ORDER — GLYCOPYRROLATE 0.2 MG/ML IJ SOLN
INTRAMUSCULAR | Status: DC | PRN
  Administered 2014-06-02: .8 mg via INTRAVENOUS
  Administered 2014-06-02: 0.2 mg via INTRAVENOUS

## 2014-06-02 MED ORDER — GLYCOPYRROLATE 0.2 MG/ML IJ SOLN
INTRAMUSCULAR | Status: AC
Start: 2014-06-02 — End: 2014-06-02
  Filled 2014-06-02: qty 3

## 2014-06-02 MED ORDER — ONDANSETRON HCL 4 MG/2ML IJ SOLN: 4.0000 mg | Freq: Four times a day (QID) | INTRAMUSCULAR | Status: DC | PRN

## 2014-06-02 MED ORDER — DIPHENHYDRAMINE HCL 12.5 MG/5ML PO ELIX: 12.5000 mg | ORAL_SOLUTION | Freq: Four times a day (QID) | ORAL | Status: DC | PRN

## 2014-06-02 MED ORDER — ALUM & MAG HYDROXIDE-SIMETH 200-200-20 MG/5ML PO SUSP: 30.0000 mL | Freq: Four times a day (QID) | ORAL | Status: DC | PRN

## 2014-06-02 MED ORDER — OXYCODONE HCL 5 MG PO TABS: 5.0000 mg | ORAL_TABLET | ORAL | Status: DC | PRN

## 2014-06-02 MED ORDER — HYDROMORPHONE HCL 1 MG/ML IJ SOLN
0.2500 mg | INTRAMUSCULAR | Status: DC | PRN
  Administered 2014-06-02: 0.5 mg via INTRAVENOUS
  Administered 2014-06-02 (×2): 0.25 mg via INTRAVENOUS

## 2014-06-02 MED ORDER — HYDROMORPHONE HCL 1 MG/ML IJ SOLN
1.0000 mg | INTRAMUSCULAR | Status: DC | PRN
  Administered 2014-06-02 (×2): 1 mg via INTRAVENOUS
  Filled 2014-06-02 (×2): qty 1

## 2014-06-02 MED ORDER — ACETAMINOPHEN 325 MG PO TABS: 650.0000 mg | ORAL_TABLET | Freq: Four times a day (QID) | ORAL | Status: DC | PRN

## 2014-06-02 MED ORDER — FENTANYL CITRATE (PF) 100 MCG/2ML IJ SOLN
INTRAMUSCULAR | Status: AC
Start: 2014-06-02 — End: 2014-06-02
  Filled 2014-06-02: qty 2

## 2014-06-02 MED ORDER — FENTANYL CITRATE (PF) 100 MCG/2ML IJ SOLN
50.0000 ug | Freq: Once | INTRAMUSCULAR | Status: AC
Start: 2014-06-02 — End: 2014-06-02
  Administered 2014-06-02: 50 ug via INTRAVENOUS
  Filled 2014-06-02: qty 2

## 2014-06-02 MED ORDER — LIP MEDEX EX OINT
TOPICAL_OINTMENT | CUTANEOUS | Status: AC
  Administered 2014-06-02: 1
  Filled 2014-06-02: qty 7

## 2014-06-02 MED ORDER — KCL IN DEXTROSE-NACL 20-5-0.45 MEQ/L-%-% IV SOLN
INTRAVENOUS | Status: DC
  Administered 2014-06-02: 15:00:00 via INTRAVENOUS
  Filled 2014-06-02 (×3): qty 1000

## 2014-06-02 MED ORDER — LACTATED RINGERS IR SOLN
Status: DC | PRN
  Administered 2014-06-02: 1000 mL

## 2014-06-02 MED ORDER — MAGIC MOUTHWASH
15.0000 mL | Freq: Four times a day (QID) | ORAL | Status: DC | PRN
  Filled 2014-06-02: qty 15

## 2014-06-02 MED ORDER — DEXAMETHASONE SODIUM PHOSPHATE 10 MG/ML IJ SOLN
INTRAMUSCULAR | Status: AC
  Filled 2014-06-02: qty 1

## 2014-06-02 MED ORDER — ONDANSETRON HCL 4 MG PO TABS: 4.0000 mg | ORAL_TABLET | Freq: Four times a day (QID) | ORAL | Status: DC | PRN

## 2014-06-02 MED ORDER — DEXTROSE 5 % IV SOLN
2.0000 g | INTRAVENOUS | Status: DC
  Administered 2014-06-02: 2 g via INTRAVENOUS

## 2014-06-02 MED ORDER — LIDOCAINE HCL (CARDIAC) 20 MG/ML IV SOLN
INTRAVENOUS | Status: DC | PRN
Start: 2014-06-02 — End: 2014-06-02
  Administered 2014-06-02: 75 mg via INTRAVENOUS
  Administered 2014-06-02: 25 mg via INTRATRACHEAL

## 2014-06-02 MED ORDER — SODIUM CHLORIDE 0.9 % IV SOLN
INTRAVENOUS | Status: DC
  Administered 2014-06-02: 09:00:00 via INTRAVENOUS

## 2014-06-02 MED ORDER — MORPHINE SULFATE 2 MG/ML IJ SOLN
1.0000 mg | INTRAMUSCULAR | Status: DC | PRN
  Administered 2014-06-02: 1 mg via INTRAVENOUS
  Filled 2014-06-02: qty 1

## 2014-06-02 MED ORDER — PROPOFOL 10 MG/ML IV BOLUS
INTRAVENOUS | Status: DC | PRN
  Administered 2014-06-02 (×3): 50 mg via INTRAVENOUS
  Administered 2014-06-02: 150 mg via INTRAVENOUS

## 2014-06-02 MED ORDER — ACETAMINOPHEN 650 MG RE SUPP: 650.0000 mg | Freq: Four times a day (QID) | RECTAL | Status: DC | PRN

## 2014-06-02 MED ORDER — LIP MEDEX EX OINT
1.0000 "application " | TOPICAL_OINTMENT | Freq: Two times a day (BID) | CUTANEOUS | Status: DC
  Filled 2014-06-02: qty 7

## 2014-06-02 MED ORDER — PROPOFOL 10 MG/ML IV BOLUS
INTRAVENOUS | Status: AC
  Filled 2014-06-02: qty 20

## 2014-06-02 MED ORDER — MENTHOL 3 MG MT LOZG: 1.0000 | LOZENGE | OROMUCOSAL | Status: DC | PRN

## 2014-06-02 MED ORDER — FENTANYL CITRATE (PF) 100 MCG/2ML IJ SOLN
INTRAMUSCULAR | Status: DC | PRN
  Administered 2014-06-02: 50 ug via INTRAVENOUS
  Administered 2014-06-02 (×3): 100 ug via INTRAVENOUS

## 2014-06-02 MED ORDER — CHLORHEXIDINE GLUCONATE 4 % EX LIQD: 1.0000 "application " | Freq: Once | CUTANEOUS | Status: DC

## 2014-06-02 MED ORDER — SODIUM CHLORIDE 0.9 % IV BOLUS (SEPSIS)
1000.0000 mL | Freq: Once | INTRAVENOUS | Status: AC
  Administered 2014-06-02: 1000 mL via INTRAVENOUS

## 2014-06-02 MED ORDER — PHENOL 1.4 % MT LIQD: 2.0000 | OROMUCOSAL | Status: DC | PRN

## 2014-06-02 MED ORDER — DIPHENHYDRAMINE HCL 50 MG/ML IJ SOLN: 12.5000 mg | Freq: Four times a day (QID) | INTRAMUSCULAR | Status: DC | PRN

## 2014-06-02 MED ORDER — HYDROMORPHONE HCL 1 MG/ML IJ SOLN
0.5000 mg | INTRAMUSCULAR | Status: DC | PRN
  Administered 2014-06-02: 1 mg via INTRAVENOUS
  Filled 2014-06-02: qty 1

## 2014-06-02 MED ORDER — LIDOCAINE HCL (CARDIAC) 20 MG/ML IV SOLN
INTRAVENOUS | Status: AC
  Filled 2014-06-02: qty 5

## 2014-06-02 MED ORDER — MIDAZOLAM HCL 5 MG/5ML IJ SOLN
INTRAMUSCULAR | Status: DC | PRN
  Administered 2014-06-02: 2 mg via INTRAVENOUS

## 2014-06-02 MED ORDER — NEOSTIGMINE METHYLSULFATE 10 MG/10ML IV SOLN
INTRAVENOUS | Status: AC
  Filled 2014-06-02: qty 1

## 2014-06-02 MED ORDER — MORPHINE SULFATE 4 MG/ML IJ SOLN
4.0000 mg | Freq: Once | INTRAMUSCULAR | Status: AC
  Administered 2014-06-02: 4 mg via INTRAVENOUS
  Filled 2014-06-02: qty 1

## 2014-06-02 MED ORDER — IOHEXOL 300 MG/ML  SOLN
INTRAMUSCULAR | Status: DC | PRN
  Administered 2014-06-02: 2.5 mL

## 2014-06-02 MED ORDER — LACTATED RINGERS IV BOLUS (SEPSIS)
1000.0000 mL | Freq: Once | INTRAVENOUS | Status: AC
  Administered 2014-06-02: 1000 mL via INTRAVENOUS

## 2014-06-02 MED ORDER — SODIUM CHLORIDE 0.9 % IV SOLN
8.0000 mg | Freq: Four times a day (QID) | INTRAVENOUS | Status: DC | PRN
  Filled 2014-06-02: qty 4

## 2014-06-02 MED ORDER — LACTATED RINGERS IV SOLN
INTRAVENOUS | Status: DC | PRN
  Administered 2014-06-02 (×2): via INTRAVENOUS

## 2014-06-02 MED ORDER — MIDAZOLAM HCL 2 MG/2ML IJ SOLN
INTRAMUSCULAR | Status: AC
  Filled 2014-06-02: qty 2

## 2014-06-02 MED ORDER — LACTATED RINGERS IV BOLUS (SEPSIS): 1000.0000 mL | Freq: Three times a day (TID) | INTRAVENOUS | Status: DC | PRN

## 2014-06-02 MED ORDER — HEPARIN SODIUM (PORCINE) 5000 UNIT/ML IJ SOLN
5000.0000 [IU] | Freq: Three times a day (TID) | INTRAMUSCULAR | Status: DC
  Filled 2014-06-02 (×4): qty 1

## 2014-06-02 MED ORDER — PROMETHAZINE HCL 25 MG/ML IJ SOLN
6.2500 mg | INTRAMUSCULAR | Status: DC | PRN
  Filled 2014-06-02: qty 1

## 2014-06-02 MED ORDER — HEPARIN SODIUM (PORCINE) 5000 UNIT/ML IJ SOLN
5000.0000 [IU] | Freq: Three times a day (TID) | INTRAMUSCULAR | Status: DC
  Administered 2014-06-02 – 2014-06-03 (×3): 5000 [IU] via SUBCUTANEOUS
  Filled 2014-06-02 (×6): qty 1

## 2014-06-02 MED ORDER — ONDANSETRON 4 MG PO TBDP: 4.0000 mg | ORAL_TABLET | Freq: Four times a day (QID) | ORAL | Status: DC | PRN

## 2014-06-02 MED ORDER — ACETAMINOPHEN 325 MG PO TABS: 650.0000 mg | ORAL_TABLET | ORAL | Status: DC | PRN

## 2014-06-02 MED ORDER — HYDROMORPHONE HCL 1 MG/ML IJ SOLN
INTRAMUSCULAR | Status: AC
  Filled 2014-06-02: qty 1

## 2014-06-02 MED ORDER — SACCHAROMYCES BOULARDII 250 MG PO CAPS
250.0000 mg | ORAL_CAPSULE | Freq: Two times a day (BID) | ORAL | Status: DC
  Filled 2014-06-02 (×2): qty 1

## 2014-06-02 MED ORDER — HYDROCODONE-ACETAMINOPHEN 5-325 MG PO TABS
1.0000 | ORAL_TABLET | ORAL | Status: DC | PRN
  Administered 2014-06-02 – 2014-06-03 (×3): 2 via ORAL
  Filled 2014-06-02 (×3): qty 2

## 2014-06-02 MED ORDER — ROCURONIUM BROMIDE 100 MG/10ML IV SOLN
INTRAVENOUS | Status: DC | PRN
  Administered 2014-06-02: 40 mg via INTRAVENOUS

## 2014-06-02 MED ORDER — ONDANSETRON HCL 4 MG/2ML IJ SOLN
INTRAMUSCULAR | Status: AC
  Filled 2014-06-02: qty 2

## 2014-06-02 MED ORDER — NEOSTIGMINE METHYLSULFATE 10 MG/10ML IV SOLN
INTRAVENOUS | Status: DC | PRN
  Administered 2014-06-02: 4 mg via INTRAVENOUS

## 2014-06-02 MED ORDER — IBUPROFEN 200 MG PO TABS: 400.0000 mg | ORAL_TABLET | Freq: Four times a day (QID) | ORAL | Status: DC | PRN

## 2014-06-02 MED ORDER — BUPIVACAINE LIPOSOME 1.3 % IJ SUSP
20.0000 mL | Freq: Once | INTRAMUSCULAR | Status: AC
  Administered 2014-06-02: 20 mL
  Filled 2014-06-02: qty 20

## 2014-06-02 MED ORDER — FENTANYL CITRATE (PF) 250 MCG/5ML IJ SOLN
INTRAMUSCULAR | Status: AC
  Filled 2014-06-02: qty 5

## 2014-06-02 MED ORDER — DEXAMETHASONE SODIUM PHOSPHATE 10 MG/ML IJ SOLN
INTRAMUSCULAR | Status: DC | PRN
  Administered 2014-06-02: 10 mg via INTRAVENOUS

## 2014-06-02 MED ORDER — BISACODYL 10 MG RE SUPP: 10.0000 mg | Freq: Two times a day (BID) | RECTAL | Status: DC | PRN

## 2014-06-02 MED ORDER — ROCURONIUM BROMIDE 100 MG/10ML IV SOLN
INTRAVENOUS | Status: AC
  Filled 2014-06-02: qty 1

## 2014-06-02 SURGICAL SUPPLY — 35 items
APL SKNCLS STERI-STRIP NONHPOA (GAUZE/BANDAGES/DRESSINGS)
APPLIER CLIP ROT 10 11.4 M/L (STAPLE) ×3
APR CLP MED LRG 11.4X10 (STAPLE) ×1
BAG SPEC RTRVL 10 TROC 200 (ENDOMECHANICALS) ×1
BENZOIN TINCTURE PRP APPL 2/3 (GAUZE/BANDAGES/DRESSINGS) IMPLANT
CABLE HIGH FREQUENCY MONO STRZ (ELECTRODE) IMPLANT
CATH REDDICK CHOLANGI 4FR 50CM (CATHETERS) ×3 IMPLANT
CLIP APPLIE ROT 10 11.4 M/L (STAPLE) ×1 IMPLANT
CLOSURE WOUND 1/2 X4 (GAUZE/BANDAGES/DRESSINGS)
COVER MAYO STAND STRL (DRAPES) ×3 IMPLANT
DECANTER SPIKE VIAL GLASS SM (MISCELLANEOUS) ×1 IMPLANT
DRAPE C-ARM 42X120 X-RAY (DRAPES) ×3 IMPLANT
DRAPE LAPAROSCOPIC ABDOMINAL (DRAPES) ×3 IMPLANT
ELECT REM PT RETURN 9FT ADLT (ELECTROSURGICAL) ×3
ELECTRODE REM PT RTRN 9FT ADLT (ELECTROSURGICAL) ×1 IMPLANT
GLOVE BIOGEL M 8.0 STRL (GLOVE) ×3 IMPLANT
GOWN STRL REUS W/TWL XL LVL3 (GOWN DISPOSABLE) ×14 IMPLANT
HEMOSTAT SURGICEL 4X8 (HEMOSTASIS) IMPLANT
IV CATH 14GX2 1/4 (CATHETERS) ×3 IMPLANT
KIT BASIN OR (CUSTOM PROCEDURE TRAY) ×3 IMPLANT
LIQUID BAND (GAUZE/BANDAGES/DRESSINGS) ×3 IMPLANT
POUCH RETRIEVAL ECOSAC 10 (ENDOMECHANICALS) IMPLANT
POUCH RETRIEVAL ECOSAC 10MM (ENDOMECHANICALS) ×2
SCISSORS LAP 5X45 EPIX DISP (ENDOMECHANICALS) ×3 IMPLANT
SCRUB PCMX 4 OZ (MISCELLANEOUS) ×3 IMPLANT
SET IRRIG TUBING LAPAROSCOPIC (IRRIGATION / IRRIGATOR) ×3 IMPLANT
SLEEVE XCEL OPT CAN 5 100 (ENDOMECHANICALS) ×6 IMPLANT
STRIP CLOSURE SKIN 1/2X4 (GAUZE/BANDAGES/DRESSINGS) IMPLANT
SUT VIC AB 4-0 SH 18 (SUTURE) ×3 IMPLANT
SYR 20CC LL (SYRINGE) ×3 IMPLANT
TOWEL OR 17X26 10 PK STRL BLUE (TOWEL DISPOSABLE) ×3 IMPLANT
TRAY LAPAROSCOPIC (CUSTOM PROCEDURE TRAY) ×3 IMPLANT
TROCAR BLADELESS OPT 5 100 (ENDOMECHANICALS) ×3 IMPLANT
TROCAR XCEL BLUNT TIP 100MML (ENDOMECHANICALS) IMPLANT
TROCAR XCEL NON-BLD 11X100MML (ENDOMECHANICALS) ×3 IMPLANT

## 2014-06-02 NOTE — H&P (Signed)
[facility]  [address] [place], [place] [address] Phone: [phone] FAX: [phone]     MAVERY MILLING  [birth year] [id number]  CARE TEAM:  PCP: No primary care provider on file.  Outpatient Care Team: Patient Care Team: Osborne Oman, MD as Consulting Physician (Obstetrics and Gynecology)  Inpatient Treatment Team: Treatment Team: Attending Provider: Linton Flemings, MD; Registered Nurse: Grier Mitts, RN; Registered Nurse: Carlynn Spry, RN; Technician: Sharma Covert, EMT; Consulting Physician: Nolon Nations, MD  This patient is a 24 y.o.female who presents today for surgical evaluation at the request of Dr Sharol Given.   Reason for evaluation: Abdominal pain, probable cholecystitis  Young healthy woman.  Comes today with her mother.  Has been struggling with intermittent abdominal pains for the past few months.  Usually epigastric.  Sometimes associated with eating.  Had nausea with it.  A couple episodes of emesis.  Most severe attack about 9 days ago.  No improvement on an antacid medications.  Another episode about 2 days ago.  Pain became more constant.  Because the attacks of been happening more frequently and intensely, she was concerned.  Pain became constant.  No improvement with Goody powder nor ibuprofen.  Came the emergency room.  Workup concerning for cholecystitis.  Surgical consultation requested.  Patient tended still avoid heavy or spicy or foods particularly spaghetti with cheese.  That seems to have center.  No problems with walking or heart or lung problems.  No personal nor family history of GI/colon cancer, inflammatory bowel disease, irritable bowel syndrome, allergy such as Celiac Sprue, dietary/dairy problems, colitis, ulcers nor gastritis.  No recent sick contacts/gastroenteritis.  No travel outside the country.  No changes in diet.  No dysphagia to solids or liquids.  No significant heartburn or reflux.  No  hematochezia, hematemesis, coffee ground emesis.  No evidence of prior gastric/peptic ulceration.    Past Medical History  Diagnosis Date  . Anemia     Past Surgical History  Procedure Laterality Date  . Cesarean section    . Tubal ligation  10/05/2011    Procedure: POST PARTUM TUBAL LIGATION;  Surgeon: Osborne Oman, MD;  Location: [facility];  Service: Gynecology;  Laterality: Bilateral;    History   Social History  . Marital Status: Single    Spouse Name: N/A  . Number of Children: N/A  . Years of Education: N/A   Occupational History  . Not on file.   Social History Main Topics  . Smoking status: Current Some Day Smoker -- 0.50 packs/day    Last Attempt to Quit: 02/09/2014  . Smokeless tobacco: Never Used  . Alcohol Use: No  . Drug Use: No  . Sexual Activity: Yes    Birth Control/ Protection: None   Other Topics Concern  . Not on file   Social History Narrative    Family History  Problem Relation Age of Onset  . Hypertension Mother   . Diabetes Maternal Grandfather     Current Facility-Administered Medications  Medication Dose Route Frequency Provider Last Rate Last Dose  . morphine 4 MG/ML injection 4 mg  4 mg Intravenous Once Linton Flemings, MD       Current Outpatient Prescriptions  Medication Sig Dispense Refill  . ibuprofen (ADVIL,MOTRIN) 800 MG tablet Take 1 tablet (800 mg total) by mouth 3 (three) times daily. (Patient not taking: Reported on 06/02/2014) 20 tablet 0     No Known Allergies  ROS: Constitutional:  No fevers, chills, sweats.  Weight stable Eyes:  No vision changes, No discharge HENT:  No sore throats, nasal drainage Lymph: No neck swelling, No bruising easily Pulmonary:  No cough, productive sputum CV: No orthopnea, PND  Patient walks 20 minutes for about 1 miles without difficulty.  No exertional chest/neck/shoulder/arm pain. GI:  No personal nor family history of GI/colon cancer, inflammatory bowel disease, irritable bowel syndrome,  allergy such as Celiac Sprue, dietary/dairy problems, colitis, ulcers nor gastritis.  No recent sick contacts/gastroenteritis.  No travel outside the country.  No changes in diet. Renal: No UTIs, No hematuria Genital:  No drainage, bleeding, masses Musculoskeletal: No severe joint pain.  Good ROM major joints Skin:  No sores or lesions.  No rashes Heme/Lymph:  No easy bleeding.  No swollen lymph nodes Neuro: No focal weakness/numbness.  No seizures Psych: No suicidal ideation.  No hallucinations  BP 108/61 mmHg  Pulse 63  Temp(Src) 98.7 F (37.1 C) (Oral)  Resp 18  Ht $R'5\' 1"'KP$  (1.549 m)  Wt 61.236 kg (135 lb)  BMI 25.52 kg/m2  SpO2 100%  LMP 05/29/2014  Physical Exam: General: Pt awake/alert/oriented x4 in no major acute distress Eyes: PERRL, normal EOM. Sclera nonicteric Neuro: CN II-XII intact w/o focal sensory/motor deficits. Lymph: No head/neck/groin lymphadenopathy Psych:  No delerium/psychosis/paranoia HENT: Normocephalic, Mucus membranes moist.  No thrush Neck: Supple, No tracheal deviation Chest: No pain.  Good respiratory excursion. CV:  Pulses intact.  Regular rhythm Abdomen: Soft, Nondistended.  Mild/moderate tenderness to palpation in epigastric and right upper quadrant regions.  No Murphy sign.  No tenderness on McBurney's point.  No left-sided abdominal pain.  No incarcerated hernias. Ext:  SCDs BLE.  No significant edema.  No cyanosis Skin: No petechiae / purpurea.  No major sores Musculoskeletal: No severe joint pain.  Good ROM major joints   Results:   Labs: Results for orders placed or performed during the hospital encounter of 06/01/14 (from the past 48 hour(s))  CBC with Differential     Status: Abnormal   Collection Time: 06/01/14 10:38 PM  Result Value Ref Range   WBC 8.9 4.0 - 10.5 K/uL   RBC 3.54 (L) 3.87 - 5.11 MIL/uL   Hemoglobin 10.5 (L) 12.0 - 15.0 g/dL   HCT 34.0 (L) 36.0 - 46.0 %   MCV 96.0 78.0 - 100.0 fL   MCH 29.7 26.0 - 34.0 pg   MCHC  30.9 30.0 - 36.0 g/dL   RDW 13.5 11.5 - 15.5 %   Platelets 324 150 - 400 K/uL   Neutrophils Relative % 50 43 - 77 %   Neutro Abs 4.4 1.7 - 7.7 K/uL   Lymphocytes Relative 43 12 - 46 %   Lymphs Abs 3.8 0.7 - 4.0 K/uL   Monocytes Relative 5 3 - 12 %   Monocytes Absolute 0.4 0.1 - 1.0 K/uL   Eosinophils Relative 2 0 - 5 %   Eosinophils Absolute 0.2 0.0 - 0.7 K/uL   Basophils Relative 0 0 - 1 %   Basophils Absolute 0.0 0.0 - 0.1 K/uL  Comprehensive metabolic panel     Status: Abnormal   Collection Time: 06/01/14 10:38 PM  Result Value Ref Range   Sodium 136 135 - 145 mmol/L   Potassium 3.4 (L) 3.5 - 5.1 mmol/L   Chloride 108 101 - 111 mmol/L   CO2 23 22 - 32 mmol/L   Glucose, Bld 111 (H) 70 - 99 mg/dL   BUN 10  6 - 20 mg/dL   Creatinine, Ser 0.67 0.44 - 1.00 mg/dL   Calcium 8.7 (L) 8.9 - 10.3 mg/dL   Total Protein 7.6 6.5 - 8.1 g/dL   Albumin 4.0 3.5 - 5.0 g/dL   AST 103 (H) 15 - 41 U/L   ALT 361 (H) 14 - 54 U/L   Alkaline Phosphatase 122 38 - 126 U/L   Total Bilirubin 0.4 0.3 - 1.2 mg/dL   GFR calc non Af Amer >60 >60 mL/min   GFR calc Af Amer >60 >60 mL/min    Comment: (NOTE) The eGFR has been calculated using the CKD EPI equation. This calculation has not been validated in all clinical situations. eGFR's persistently <60 mL/min signify possible Chronic Kidney Disease.    Anion gap 5 5 - 15  Lipase, blood     Status: None   Collection Time: 06/01/14 10:38 PM  Result Value Ref Range   Lipase 25 22 - 51 U/L  Urinalysis, Routine w reflex microscopic     Status: Abnormal   Collection Time: 06/01/14 10:46 PM  Result Value Ref Range   Color, Urine YELLOW YELLOW   APPearance CLEAR CLEAR   Specific Gravity, Urine 1.010 1.005 - 1.030   pH 6.5 5.0 - 8.0   Glucose, UA NEGATIVE NEGATIVE mg/dL   Hgb urine dipstick LARGE (A) NEGATIVE   Bilirubin Urine NEGATIVE NEGATIVE   Ketones, ur NEGATIVE NEGATIVE mg/dL   Protein, ur NEGATIVE NEGATIVE mg/dL   Urobilinogen, UA 0.2 0.0 - 1.0  mg/dL   Nitrite NEGATIVE NEGATIVE   Leukocytes, UA NEGATIVE NEGATIVE  Urine microscopic-add on     Status: None   Collection Time: 06/01/14 10:46 PM  Result Value Ref Range   Squamous Epithelial / LPF RARE RARE   RBC / HPF TOO NUMEROUS TO COUNT <3 RBC/hpf    Imaging / Studies: US Abdomen Limited  06/02/2014   CLINICAL DATA:  Right upper quadrant pain for 2 months. Elevated liver function tests.  EXAM: US ABDOMEN LIMITED - RIGHT UPPER QUADRANT  COMPARISON:  None.  FINDINGS: Gallbladder:  There are multiple calculi within the gallbladder lumen, up to 1.3 mm. There is moderate gallbladder wall thickening, over 5 mm. There is strike a shin of the gallbladder wall consistent with intramural edema.  Common bile duct:  Diameter: 4.8 mm  Liver:  No focal lesion identified. Within normal limits in parenchymal echogenicity.  IMPRESSION: Cholelithiasis with gallbladder mural thickening and edema, concerning for acute cholecystitis.   Electronically Signed   By: Andreas Newport M.D.   On: 06/02/2014 02:37    Medications / Allergies: per chart  Antibiotics: Anti-infectives    None      Assessment  Larey Seat  24 y.o. female       Problem List:  Principal Problem:   Calculus of gallbladder with acute cholecystitis   History physical and especially ultrasound concerning for acute cholecystitis.  Differential diagnosis otherwise underwhelming  Plan:  Admit.  IV antibiotics.  Laparoscopic cholecystectomy cholangiogram:  The anatomy & physiology of hepatobiliary & pancreatic function was discussed.  The pathophysiology of gallbladder dysfunction was discussed.  Natural history risks without surgery was discussed.   I feel the risks of no intervention will lead to serious problems that outweigh the operative risks; therefore, I recommended cholecystectomy to remove the pathology.  I explained laparoscopic techniques with possible need for an open approach.  Probable cholangiogram to  evaluate the bilary tract was explained as well.  Risks such as bleeding, infection, abscess, leak, injury to other organs, need for further treatment, stroke, heart attack, death, and other risks were discussed.  I noted a good likelihood this will help address the problem.  Possibility that this will not correct all abdominal symptoms was explained.  Goals of post-operative recovery were discussed as well.  We will work to minimize complications.  An educational handout further explaining the pathology and treatment options was given as well.  Questions were answered.  The patient & mother express understanding & wish to proceed with surgery.  Biggest concern is taking care of the patient's and mother's children.  Trying to sort that out.   -VTE prophylaxis- SCDs, etc  -mobilize as tolerated to help recovery    Adin Hector, M.D., F.A.C.S. Gastrointestinal and Minimally Invasive Surgery [facility] [address] [place], [place] [address] [phone] Main / Paging   06/02/2014  Note: Portions of this report may have been transcribed using voice recognition software. Every effort was made to ensure accuracy; however, inadvertent computerized transcription errors may be present.   Any transcriptional errors that result from this process are unintentional.

## 2014-06-02 NOTE — ED Notes (Signed)
Attempted to call report to floor, informed AC wants pt to come up after shift change. Charge nurse made aware of same.

## 2014-06-02 NOTE — Anesthesia Preprocedure Evaluation (Addendum)
Anesthesia Evaluation  Patient identified by MRN, date of birth, ID band Patient awake    Reviewed: Allergy & Precautions, NPO status , Patient's Chart, lab work & pertinent test results  Airway Mallampati: II  TM Distance: >3 FB Neck ROM: Full    Dental no notable dental hx.    Pulmonary Current Smoker,  breath sounds clear to auscultation  Pulmonary exam normal       Cardiovascular negative cardio ROS Normal cardiovascular examRhythm:Regular Rate:Normal     Neuro/Psych negative neurological ROS  negative psych ROS   GI/Hepatic negative GI ROS, Neg liver ROS,   Endo/Other  negative endocrine ROS  Renal/GU negative Renal ROS  negative genitourinary   Musculoskeletal negative musculoskeletal ROS (+)   Abdominal   Peds negative pediatric ROS (+)  Hematology  (+) anemia ,   Anesthesia Other Findings   Reproductive/Obstetrics S/P tubal ligation.                            Anesthesia Physical Anesthesia Plan  ASA: II  Anesthesia Plan: General   Post-op Pain Management:    Induction: Intravenous  Airway Management Planned: Oral ETT  Additional Equipment:   Intra-op Plan:   Post-operative Plan: Extubation in OR  Informed Consent: I have reviewed the patients History and Physical, chart, labs and discussed the procedure including the risks, benefits and alternatives for the proposed anesthesia with the patient or authorized representative who has indicated his/her understanding and acceptance.   Dental advisory given  Plan Discussed with: CRNA  Anesthesia Plan Comments:         Anesthesia Quick Evaluation

## 2014-06-02 NOTE — Anesthesia Postprocedure Evaluation (Signed)
  Anesthesia Post-op Note  Patient: Alicia Branch  Procedure(s) Performed: Procedure(s) (LRB): LAPAROSCOPIC CHOLECYSTECTOMY WITH INTRAOPERATIVE CHOLANGIOGRAM (N/A)  Patient Location: PACU  Anesthesia Type: General  Level of Consciousness: awake and alert   Airway and Oxygen Therapy: Patient Spontanous Breathing  Post-op Pain: mild  Post-op Assessment: Post-op Vital signs reviewed, Patient's Cardiovascular Status Stable, Respiratory Function Stable, Patent Airway and No signs of Nausea or vomiting  Last Vitals:  Filed Vitals:   06/02/14 1400  BP:   Pulse: 48  Temp:   Resp: 9    Post-op Vital Signs: stable   Complications: No apparent anesthesia complications

## 2014-06-02 NOTE — ED Provider Notes (Signed)
CSN: [id number]     Arrival date & time 06/01/14  2206 History   First MD Initiated Contact with Patient 06/02/14 0040     Chief Complaint  Patient presents with  . Abdominal Pain     (Consider location/radiation/quality/duration/timing/severity/associated sxs/prior Treatment) HPI 24 year old female presents to the emergency department from home with complaint of upper abdominal pain intermittently for the last 2 months.  Over the last week.  It is seemed worse.  She reports that she vomited last night.  She reports pain is usually worse at night when lying down.  She denies any fever or chills.  Patient also mentions intermittent headaches and neck pain.  This is been ongoing for last 3-4 months.  She has tried ibuprofen and Goody powder without improvement. Past Medical History  Diagnosis Date  . Anemia    Past Surgical History  Procedure Laterality Date  . Cesarean section    . Tubal ligation  10/05/2011    Procedure: POST PARTUM TUBAL LIGATION;  Surgeon: Tereso NewcomerUgonna A Anyanwu, MD;  Location: [facility];  Service: Gynecology;  Laterality: Bilateral;   Family History  Problem Relation Age of Onset  . Hypertension Mother   . Diabetes Maternal Grandfather    History  Substance Use Topics  . Smoking status: Current Some Day Smoker -- 0.50 packs/day    Last Attempt to Quit: 02/09/2014  . Smokeless tobacco: Never Used  . Alcohol Use: No   OB History    Gravida Para Term Preterm AB TAB SAB Ectopic Multiple Living   5 4 4  1 1    4      Review of Systems   See History of Present Illness; otherwise all other systems are reviewed and negative  Allergies  Review of patient's allergies indicates no known allergies.  Home Medications   Prior to Admission medications   Medication Sig Start Date End Date Taking? Authorizing Provider  ibuprofen (ADVIL,MOTRIN) 800 MG tablet Take 1 tablet (800 mg total) by mouth 3 (three) times daily. Patient not taking: Reported on 06/02/2014 03/28/14   Graylon GoodZachary  H Baker, PA-C   BP 108/61 mmHg  Pulse 63  Temp(Src) 98.7 F (37.1 C) (Oral)  Resp 18  Ht 5\' 1"  (1.549 m)  Wt 135 lb (61.236 kg)  BMI 25.52 kg/m2  SpO2 100%  LMP 05/29/2014 Physical Exam  Constitutional: She is oriented to person, place, and time. She appears well-developed and well-nourished.  HENT:  Head: Normocephalic and atraumatic.  Nose: Nose normal.  Mouth/Throat: Oropharynx is clear and moist.  Eyes: Conjunctivae and EOM are normal. Pupils are equal, round, and reactive to light.  Neck: Normal range of motion. Neck supple. No JVD present. No tracheal deviation present. No thyromegaly present.  Cardiovascular: Normal rate, regular rhythm, normal heart sounds and intact distal pulses.  Exam reveals no gallop and no friction rub.   No murmur heard. Pulmonary/Chest: Effort normal and breath sounds normal. No stridor. No respiratory distress. She has no wheezes. She has no rales. She exhibits no tenderness.  Abdominal: Soft. Bowel sounds are normal. She exhibits no distension and no mass. There is tenderness (patient has significant tenderness in right upper quadrant and epigastrium.  Positive Murphy sign). There is no rebound and no guarding.  Musculoskeletal: Normal range of motion. She exhibits no edema or tenderness.  Lymphadenopathy:    She has no cervical adenopathy.  Neurological: She is alert and oriented to person, place, and time. She displays normal reflexes. She exhibits normal muscle tone.  Coordination normal.  Skin: Skin is warm and dry. No rash noted. No erythema. No pallor.  Psychiatric: She has a normal mood and affect. Her behavior is normal. Judgment and thought content normal.  Nursing note and vitals reviewed.   ED Course  Procedures (including critical care time) Labs Review Labs Reviewed  CBC WITH DIFFERENTIAL/PLATELET - Abnormal; Notable for the following:    RBC 3.54 (*)    Hemoglobin 10.5 (*)    HCT 34.0 (*)    All other components within normal  limits  COMPREHENSIVE METABOLIC PANEL - Abnormal; Notable for the following:    Potassium 3.4 (*)    Glucose, Bld 111 (*)    Calcium 8.7 (*)    AST 103 (*)    ALT 361 (*)    All other components within normal limits  URINALYSIS, ROUTINE W REFLEX MICROSCOPIC - Abnormal; Notable for the following:    Hgb urine dipstick LARGE (*)    All other components within normal limits  LIPASE, BLOOD  URINE MICROSCOPIC-ADD ON    Imaging Review [place] Abdomen Limited  06/02/2014   CLINICAL DATA:  Right upper quadrant pain for 2 months. Elevated liver function tests.  EXAM: US ABDOMEN LIMITED - RIGHT UPPER QUADRANT  COMPARISON:  None.  FINDINGS: Gallbladder:  There are multiple calculi within the gallbladder lumen, up to 1.3 mm. There is moderate gallbladder wall thickening, over 5 mm. There is strike a shin of the gallbladder wall consistent with intramural edema.  Common bile duct:  Diameter: 4.8 mm  Liver:  No focal lesion identified. Within normal limits in parenchymal echogenicity.  IMPRESSION: Cholelithiasis with gallbladder mural thickening and edema, concerning for acute cholecystitis.   Electronically Signed   By: Ellery Plunkaniel R Mitchell M.D.   On: 06/02/2014 02:37     EKG Interpretation None      MDM   Final diagnoses:  Cholecystitis, acute    24 year old female with elevated LFTs, right upper quadrant pain.  Right upper quadrant ultrasound shows cholelithiasis with gallbladder thickening and edema.  After pain medications.  She is still having persistent pain.  Will discuss with general surgery given concerns for cholecystitis.    Marisa Severinlga Ayson Cherubini, MD 06/02/14 [id number]

## 2014-06-02 NOTE — Interval H&P Note (Signed)
History and Physical Interval Note:  06/02/2014 11:22 AM  Alicia Branch  has presented today for surgery, with the diagnosis of Cholecystitis  The various methods of treatment have been discussed with the patient and family. After consideration of risks, benefits and other options for treatment, the patient has consented to  Procedure(s): LAPAROSCOPIC CHOLECYSTECTOMY WITH INTRAOPERATIVE CHOLANGIOGRAM (N/A) as a surgical intervention .  The patient's history has been reviewed, patient examined, no change in status, stable for surgery.  I have reviewed the patient's chart and labs.  Questions were answered to the patient's satisfaction.     [name] B

## 2014-06-02 NOTE — Anesthesia Procedure Notes (Signed)
Procedure Name: Intubation Date/Time: 06/02/2014 11:41 AM Performed by: Illene SilverEVANS, Thorin Starner E Pre-anesthesia Checklist: Patient identified, Emergency Drugs available, Suction available and Patient being monitored Patient Re-evaluated:Patient Re-evaluated prior to inductionOxygen Delivery Method: Circle System Utilized Preoxygenation: Pre-oxygenation with 100% oxygen Intubation Type: IV induction Ventilation: Mask ventilation without difficulty Laryngoscope Size: Mac and 3 Grade View: Grade II Tube type: Oral Tube size: 7.0 mm Number of attempts: 3 (two prior attempts by paramedic student Vedia PereyraMellisa Tedder) Airway Equipment and Method: Stylet and Oral airway Placement Confirmation: ETT inserted through vocal cords under direct vision,  positive ETCO2 and breath sounds checked- equal and bilateral Secured at: 21 cm Tube secured with: Tape Dental Injury: Teeth and Oropharynx as per pre-operative assessment

## 2014-06-02 NOTE — Transfer of Care (Signed)
Immediate Anesthesia Transfer of Care Note  Patient: Alicia Branch  Procedure(s) Performed: Procedure(s): LAPAROSCOPIC CHOLECYSTECTOMY WITH INTRAOPERATIVE CHOLANGIOGRAM (N/A)  Patient Location: PACU  Anesthesia Type:General  Level of Consciousness: awake, alert , oriented and patient cooperative  Airway & Oxygen Therapy: Patient Spontanous Breathing and Patient connected to face mask oxygen  Post-op Assessment: Report given to RN, Post -op Vital signs reviewed and stable and Patient moving all extremities X 4  Post vital signs: stable  Last Vitals:  Filed Vitals:   06/02/14 1323  BP: 125/71  Pulse:   Temp:   Resp:     Complications: No apparent anesthesia complications

## 2014-06-02 NOTE — Op Note (Signed)
Alicia Branch @DATE @  06/02/2014  1:29 PM  Procedure: Laparoscopic Cholecystectomy with intraoperative cholangiogram  Surgeon: Susy FrizzleMatt B. Daphine DeutscherMartin, MD, FACS Asst:  none  Anes:  General  Drains:  None  Findings: Chronic cholecystitis with normal IOC  Description of Procedure: The patient was taken to OR 6 and given general anesthesia.  The patient was prepped with PCMX and draped sterilely. A time out was performed.  Access to the abdomen was achieved with a 5 mm Optiview through the right upper quadrant.  Port placement included three five mm trocars and one 11 in the upper midline.    The gallbladder was visualized and the fundus was grasped and the gallbladder was elevated. Traction on the infundibulum allowed for successful demonstration of the critical view. Inflammatory changes were mild.  The cystic duct was identified and clipped up on the gallbladder and an incision was made in the cystic duct and the Reddick catheter was inserted after milking the cystic duct of any debris. A dynamic cholangiogram was performed which demonstrated normal intrahepatic filling.    The cystic duct was then triple clipped and divided, the cystic artery was double clipped and divided and then the gallbladder was removed from the gallbladder bed. Removal of the gallbladder from the gallbladder bed was performed without entering it.  The gallbladder was then placed in a bag and brought out through one of the trocar sites. The gallbladder bed was inspected and no bleeding or bile leaks were seen.   Laparoscopic visualization was used when closing fascial defects for trocar sites.   Incisions were injected with Exparel and closed with 4-0 Vicryl and Liquiban on the skin.  Sponge and needle count were correct.    The patient was taken to the recovery room in satisfactory condition.

## 2014-06-03 ENCOUNTER — Encounter (HOSPITAL_COMMUNITY): Payer: Self-pay | Admitting: Surgery

## 2014-06-03 LAB — COMPREHENSIVE METABOLIC PANEL
ALT: 221 U/L — AB (ref 14–54)
AST: 57 U/L — ABNORMAL HIGH (ref 15–41)
Albumin: 3.4 g/dL — ABNORMAL LOW (ref 3.5–5.0)
Alkaline Phosphatase: 95 U/L (ref 38–126)
Anion gap: 10 (ref 5–15)
CO2: 22 mmol/L (ref 22–32)
Calcium: 8.7 mg/dL — ABNORMAL LOW (ref 8.9–10.3)
Chloride: 105 mmol/L (ref 101–111)
Creatinine, Ser: 0.55 mg/dL (ref 0.44–1.00)
GLUCOSE: 109 mg/dL — AB (ref 70–99)
Potassium: 4.2 mmol/L (ref 3.5–5.1)
SODIUM: 137 mmol/L (ref 135–145)
TOTAL PROTEIN: 6.6 g/dL (ref 6.5–8.1)
Total Bilirubin: 0.8 mg/dL (ref 0.3–1.2)

## 2014-06-03 LAB — CBC
HCT: 31.5 % — ABNORMAL LOW (ref 36.0–46.0)
Hemoglobin: 10.2 g/dL — ABNORMAL LOW (ref 12.0–15.0)
MCH: 30.9 pg (ref 26.0–34.0)
MCHC: 32.4 g/dL (ref 30.0–36.0)
MCV: 95.5 fL (ref 78.0–100.0)
Platelets: 299 10*3/uL (ref 150–400)
RBC: 3.3 MIL/uL — AB (ref 3.87–5.11)
RDW: 13.6 % (ref 11.5–15.5)
WBC: 8.9 10*3/uL (ref 4.0–10.5)

## 2014-06-03 MED ORDER — HYDROCODONE-ACETAMINOPHEN 5-325 MG PO TABS: 1.0000 | ORAL_TABLET | ORAL | Status: DC | PRN

## 2014-06-03 NOTE — Discharge Summary (Signed)
Physician Discharge Summary  Patient ID: Dionne Bucyddia L Dekay MRN: [id number] DOB/AGE: 24 y.o.  Admit date: 06/01/2014 Discharge date: 06/03/2014  Admitting Diagnosis: Calculus of gallbladder with acute cholecystitis   Discharge Diagnosis Patient Active Problem List   Diagnosis Date Noted  . Calculus of gallbladder with acute cholecystitis 06/02/2014  . Acute cholecystitis with chronic cholecystitis 06/02/2014    Consultants none  Imaging: Dg Cholangiogram Operative  06/02/2014   CLINICAL DATA:  Cholelithiasis. Evaluate patency of the cystic duct.  EXAM: INTRAOPERATIVE CHOLANGIOGRAM  FLUOROSCOPY TIME:  17 seconds  COMPARISON:  Abdominal ultrasound - 06/02/2014;  FINDINGS: Intraoperative cholangiographic images of the right upper abdominal quadrant during laparoscopic cholecystectomy are provided for review.  Surgical clips overlie the expected location of the gallbladder fossa.  Contrast injection demonstrates selective cannulation of the central aspect of the cystic duct.  There is passage of contrast through the central aspect of the cystic duct with filling of a non dilated common bile duct. There is passage of contrast though the CBD and into the descending portion of the duodenum.  There is minimal reflux of injected contrast into the common hepatic duct and central aspect of the non dilated intrahepatic biliary system.  There are no discrete filling defects within the opacified portions of the biliary system to suggest the presence of choledocholithiasis.  IMPRESSION: No evidence of choledocholithiasis.   Electronically Signed   By: Simonne ComeJohn  Watts M.D.   On: 06/02/2014 13:15   [place] Abdomen Limited  06/02/2014   CLINICAL DATA:  Right upper quadrant pain for 2 months. Elevated liver function tests.  EXAM: US ABDOMEN LIMITED - RIGHT UPPER QUADRANT  COMPARISON:  None.  FINDINGS: Gallbladder:  There are multiple calculi within the gallbladder lumen, up to 1.3 mm. There is moderate  gallbladder wall thickening, over 5 mm. There is strike a shin of the gallbladder wall consistent with intramural edema.  Common bile duct:  Diameter: 4.8 mm  Liver:  No focal lesion identified. Within normal limits in parenchymal echogenicity.  IMPRESSION: Cholelithiasis with gallbladder mural thickening and edema, concerning for acute cholecystitis.   Electronically Signed   By: Ellery Plunkaniel R Mitchell M.D.   On: 06/02/2014 02:37    Procedures Laparoscopic cholecystectomy with IOC---Dr. Charolett BumpersMartin  Hospital Course:  [facility]ort[facility]dd[facility].  Workup showed acute calculous cholecystitis.  Patient was admitted and underwent procedure listed above.  Tolerated procedure well and was transferred to the floor.  Diet was advanced as tolerated.  On POD#1, the patient was voiding well, tolerating diet, ambulating well, pain well controlled, vital signs stable, incisions c/d/i and felt stable for discharge home.  Medication risks, benefits and therapeutic alternatives were reviewed with the patient.  She verbalizes understanding.  Patient will follow up in our office in 3 weeks and knows to call with questions or concerns.  Physical Exam: General:  Alert, NAD, pleasant, comfortable Abd:  Soft, ND, mild tenderness, incisions C/D/I    Medication List    TAKE these medications        HYDROcodone-acetaminophen 5-325 MG per tablet  Commonly known as:  NORCO/VICODIN  Take 1-2 tablets by mouth every 4 (four) hours as needed for moderate pain.     ibuprofen 800 MG tablet  Commonly known as:  ADVIL,MOTRIN  Take 1 tablet (800 mg total) by mouth 3 (three) times daily.             Follow-up Information    Follow up with [facility] On  06/23/2014.   Specialty:  General Surgery   Why:  arrive by [facility] for a 2:30PM post op check   Contact information:   [address] [place] [id number] [phone]       Signed: Ashok Norrismina Kelse Ploch, [facility]BC [facility] [phone]  06/03/2014, 8:46 AM

## 2014-06-03 NOTE — Progress Notes (Signed)
Patient is alert and oriented, vital signs are stable, incisions are within normal limits, patient is tolerating her diet without complaints of nausea or vomiting, patient with flatus, [facility], discharge instructions reviewed with patient, questions and concerns answered, pain controlled adequately with Cloria SpringNorco Ladeana Laplant N 2:22 PM 06-03-2014

## 2014-06-03 NOTE — Discharge Instructions (Signed)

## 2014-08-09 ENCOUNTER — Inpatient Hospital Stay (HOSPITAL_COMMUNITY)
Admission: AD | Admit: 2014-08-09 | Discharge: 2014-08-09 | Disposition: A | Discharge status: Home / Self Care | Payer: Medicaid Other | Source: Ambulatory Visit | Attending: Obstetrics and Gynecology | Admitting: Obstetrics and Gynecology

## 2014-08-09 ENCOUNTER — Encounter (HOSPITAL_COMMUNITY): Payer: Self-pay | Admitting: *Deleted

## 2014-08-09 DIAGNOSIS — N760 Acute vaginitis: Secondary | ICD-10-CM | POA: Insufficient documentation

## 2014-08-09 DIAGNOSIS — N898 Other specified noninflammatory disorders of vagina: Secondary | ICD-10-CM | POA: Diagnosis present

## 2014-08-09 DIAGNOSIS — A499 Bacterial infection, unspecified: Secondary | ICD-10-CM | POA: Diagnosis not present

## 2014-08-09 DIAGNOSIS — B9689 Other specified bacterial agents as the cause of diseases classified elsewhere: Secondary | ICD-10-CM

## 2014-08-09 DIAGNOSIS — F17210 Nicotine dependence, cigarettes, uncomplicated: Secondary | ICD-10-CM | POA: Diagnosis not present

## 2014-08-09 LAB — POCT PREGNANCY, URINE: PREG TEST UR: NEGATIVE

## 2014-08-09 LAB — URINALYSIS, ROUTINE W REFLEX MICROSCOPIC
BILIRUBIN URINE: NEGATIVE
Glucose, UA: NEGATIVE mg/dL
Ketones, ur: NEGATIVE mg/dL
Leukocytes, UA: NEGATIVE
Nitrite: NEGATIVE
PROTEIN: NEGATIVE mg/dL
Specific Gravity, Urine: >1.03 — ABNORMAL HIGH (ref 1.005–1.030)
Urobilinogen, UA: 0.2 mg/dL (ref 0.0–1.0)
pH: 5.5 (ref 5.0–8.0)

## 2014-08-09 LAB — URINE MICROSCOPIC-ADD ON

## 2014-08-09 LAB — WET PREP, GENITAL
Trich, Wet Prep: NONE SEEN
WBC, Wet Prep HPF POC: NONE SEEN
Yeast Wet Prep HPF POC: NONE SEEN

## 2014-08-09 MED ORDER — METRONIDAZOLE 500 MG PO TABS: 500.0000 mg | ORAL_TABLET | Freq: Two times a day (BID) | ORAL | Status: DC

## 2014-08-09 NOTE — MAU Provider Note (Signed)
History     CSN: [id number]  Arrival date and time: 08/09/14 1943   First Provider Initiated Contact with Patient 08/09/14 2014      No chief complaint on file.  HPI Ms. Alicia Branch is a 24 y.o. [id number] who presents to MAU today with complaint of heavy vaginal discharge. She states white, thick discharge noted x 4 days with associated itching. She denies vaginal bleeding, fever, UTI symptoms, N/V or constipation. She states intermittent diarrhea. This is not a new problem. She has had issues with this since cholecystectomy. She also states mild intermittent associated cramping. She has not taken anything for pain.  OB History    Gravida Para Term Preterm AB TAB SAB Ectopic Multiple Living   5 4 4  1 1    4       Past Medical History  Diagnosis Date  . Anemia     Past Surgical History  Procedure Laterality Date  . Cesarean section    . Tubal ligation  10/05/2011    Procedure: POST PARTUM TUBAL LIGATION;  Surgeon: Tereso NewcomerUgonna A Anyanwu, MD;  Location: [facility];  Service: Gynecology;  Laterality: Bilateral;  . Cholecystectomy N/A 06/02/2014    Procedure: LAPAROSCOPIC CHOLECYSTECTOMY WITH INTRAOPERATIVE CHOLANGIOGRAM;  Surgeon: Luretha MurphyMatthew Martin, MD;  Location: [facility];  Service: General;  Laterality: N/A;    Family History  Problem Relation Age of Onset  . Hypertension Mother   . Diabetes Maternal Grandfather     History  Substance Use Topics  . Smoking status: Current Some Day Smoker -- 0.50 packs/day    Types: Cigarettes    Last Attempt to Quit: 02/09/2014  . Smokeless tobacco: Never Used  . Alcohol Use: Yes     Comment: socially    Allergies: No Known Allergies  Prescriptions prior to admission  Medication Sig Dispense Refill Last Dose  . Aspirin-Salicylamide-Caffeine (BC HEADACHE PO) Take 1 packet by mouth daily as needed (for headache).   Past Week at Unknown time  . HYDROcodone-acetaminophen (NORCO/VICODIN) 5-325 MG per tablet Take 1-2 tablets by mouth every 4 (four)  hours as needed for moderate pain. (Patient not taking: Reported on 08/09/2014) 30 tablet 0   . ibuprofen (ADVIL,MOTRIN) 800 MG tablet Take 1 tablet (800 mg total) by mouth 3 (three) times daily. (Patient not taking: Reported on 06/02/2014) 20 tablet 0     Review of Systems  Constitutional: Negative for fever and malaise/fatigue.  Gastrointestinal: Positive for abdominal pain. Negative for nausea, vomiting, diarrhea and constipation.  Genitourinary: Negative for dysuria, urgency and frequency.       + vaginal discharge Neg - vaginal bleeding   Physical Exam   Blood pressure 102/69, pulse 94, temperature 98.6 F (37 C), temperature source Oral, resp. rate 16, height 5\' 1"  (1.549 m), weight 119 lb (53.978 kg), last menstrual period 07/24/2014, SpO2 100 %.  Physical Exam  Nursing note and vitals reviewed. Constitutional: She is oriented to person, place, and time. She appears well-developed and well-nourished. No distress.  HENT:  Head: Normocephalic and atraumatic.  Cardiovascular: Normal rate.   Respiratory: Effort normal.  GI: Soft. She exhibits no distension and no mass. There is no tenderness. There is no rebound and no guarding.  Genitourinary: Uterus is not enlarged and not tender. Cervix exhibits no motion tenderness, no discharge and no friability. Right adnexum displays no mass and no tenderness. Left adnexum displays no mass and no tenderness. No bleeding in the vagina. Vaginal discharge (small amount of thick, white discharge noted)  found.  Neurological: She is alert and oriented to person, place, and time.  Skin: Skin is warm and dry. No erythema.  Psychiatric: She has a normal mood and affect.   Results for orders placed or performed during the hospital encounter of 08/09/14 (from the past 24 hour(s))  Urinalysis, Routine w reflex microscopic (not at [facility])     Status: Abnormal   Collection Time: 08/09/14  7:54 PM  Result Value Ref Range   Color, Urine AMBER (A) YELLOW    APPearance CLEAR CLEAR   Specific Gravity, Urine >1.030 (H) 1.005 - 1.030   pH 5.5 5.0 - 8.0   Glucose, UA NEGATIVE NEGATIVE mg/dL   Hgb urine dipstick TRACE (A) NEGATIVE   Bilirubin Urine NEGATIVE NEGATIVE   Ketones, ur NEGATIVE NEGATIVE mg/dL   Protein, ur NEGATIVE NEGATIVE mg/dL   Urobilinogen, UA 0.2 0.0 - 1.0 mg/dL   Nitrite NEGATIVE NEGATIVE   Leukocytes, UA NEGATIVE NEGATIVE  Urine microscopic-add on     Status: Abnormal   Collection Time: 08/09/14  7:54 PM  Result Value Ref Range   Squamous Epithelial / LPF FEW (A) RARE   Bacteria, UA RARE RARE   Urine-Other MUCOUS PRESENT   Pregnancy, urine POC     Status: None   Collection Time: 08/09/14  8:03 PM  Result Value Ref Range   Preg Test, Ur NEGATIVE NEGATIVE  Wet prep, genital     Status: Abnormal   Collection Time: 08/09/14  8:15 PM  Result Value Ref Range   Yeast Wet Prep HPF POC NONE SEEN NONE SEEN   Trich, Wet Prep NONE SEEN NONE SEEN   Clue Cells Wet Prep HPF POC MODERATE (A) NONE SEEN   WBC, Wet Prep HPF POC NONE SEEN NONE SEEN    MAU Course  Procedures None  MDM UPT - negative UA, wet prep, GC/chlamydia, HIV and RPR today  Assessment and Plan  A: Bacterial vaginosis  P: Discharge home Rx for Flagyl given to patient Hygiene products for avoiding recurrence discussed GC/Chlamydia, HIV and RPR pending Patient advised to follow-up with [facility] as scheduled for routine health maintenance or sooner PRN Patient may return to MAU as needed or if her condition were to change or worsen   Marny Lowenstein, PA-C  08/09/2014, 8:29 PM

## 2014-08-09 NOTE — Discharge Instructions (Signed)
Bacterial Vaginosis Bacterial vaginosis is a vaginal infection that occurs when the normal balance of bacteria in the vagina is disrupted. It results from an overgrowth of certain bacteria. This is the most common vaginal infection in women of childbearing age. Treatment is important to prevent complications, especially in pregnant women, as it can cause a premature delivery. CAUSES  Bacterial vaginosis is caused by an increase in harmful bacteria that are normally present in smaller amounts in the vagina. Several different kinds of bacteria can cause bacterial vaginosis. However, the reason that the condition develops is not fully understood. RISK FACTORS Certain activities or behaviors can put you at an increased risk of developing bacterial vaginosis, including:  Having a new sex partner or multiple sex partners.  Douching.  Using an intrauterine device (IUD) for contraception. Women do not get bacterial vaginosis from toilet seats, bedding, swimming pools, or contact with objects around them. SIGNS AND SYMPTOMS  Some women with bacterial vaginosis have no signs or symptoms. Common symptoms include:  Grey vaginal discharge.  A fishlike odor with discharge, especially after sexual intercourse.  Itching or burning of the vagina and vulva.  Burning or pain with urination. DIAGNOSIS  Your health care provider will take a medical history and examine the vagina for signs of bacterial vaginosis. A sample of vaginal fluid may be taken. Your health care provider will look at this sample under a microscope to check for bacteria and abnormal cells. A vaginal pH test may also be done.  TREATMENT  Bacterial vaginosis may be treated with antibiotic medicines. These may be given in the form of a pill or a vaginal cream. A second round of antibiotics may be prescribed if the condition comes back after treatment.  HOME CARE INSTRUCTIONS   Only take over-the-counter or prescription medicines as  directed by your health care provider.  If antibiotic medicine was prescribed, take it as directed. Make sure you finish it even if you start to feel better.  Do not have sex until treatment is completed.  Tell all sexual partners that you have a vaginal infection. They should see their health care provider and be treated if they have problems, such as a mild rash or itching.  Practice safe sex by using condoms and only having one sex partner. SEEK MEDICAL CARE IF:   Your symptoms are not improving after 3 days of treatment.  You have increased discharge or pain.  You have a fever. MAKE SURE YOU:   Understand these instructions.  Will watch your condition.  Will get help right away if you are not doing well or get worse. FOR MORE INFORMATION  Centers for Disease Control and Prevention, Division of STD Prevention: www.cdc.gov/std American Sexual Health Association (ASHA): www.ashastd.org  Document Released: 01/09/2005 Document Revised: 10/30/2012 Document Reviewed: 08/21/2012 ExitCare Patient Information 2015 [employer]. This information is not intended to replace advice given to you by your health care provider. Make sure you discuss any questions you have with your health care provider.  

## 2014-08-09 NOTE — MAU Note (Signed)
Pt reports she is having "weird" discharge. States she has had the discharge x 4 days, it is very heavy and she has vaginal itching.

## 2014-08-10 LAB — HIV ANTIBODY (ROUTINE TESTING W REFLEX): HIV SCREEN 4TH GENERATION: NONREACTIVE

## 2014-08-10 LAB — RPR: RPR Ser Ql: NONREACTIVE

## 2014-08-10 LAB — GC/CHLAMYDIA PROBE AMP (~~LOC~~) NOT AT ARMC
Chlamydia: NEGATIVE
NEISSERIA GONORRHEA: NEGATIVE

## 2015-01-07 ENCOUNTER — Encounter (HOSPITAL_COMMUNITY): Payer: Self-pay | Admitting: Emergency Medicine

## 2015-01-07 ENCOUNTER — Emergency Department (HOSPITAL_COMMUNITY)
Admission: EM | Admit: 2015-01-07 | Discharge: 2015-01-07 | Disposition: A | Discharge status: Home / Self Care | Payer: Medicaid Other | Attending: Emergency Medicine | Admitting: Emergency Medicine

## 2015-01-07 DIAGNOSIS — N760 Acute vaginitis: Secondary | ICD-10-CM | POA: Diagnosis not present

## 2015-01-07 DIAGNOSIS — J029 Acute pharyngitis, unspecified: Secondary | ICD-10-CM | POA: Diagnosis present

## 2015-01-07 DIAGNOSIS — G43909 Migraine, unspecified, not intractable, without status migrainosus: Secondary | ICD-10-CM | POA: Diagnosis not present

## 2015-01-07 DIAGNOSIS — Z862 Personal history of diseases of the blood and blood-forming organs and certain disorders involving the immune mechanism: Secondary | ICD-10-CM | POA: Insufficient documentation

## 2015-01-07 DIAGNOSIS — R42 Dizziness and giddiness: Secondary | ICD-10-CM | POA: Diagnosis not present

## 2015-01-07 DIAGNOSIS — R197 Diarrhea, unspecified: Secondary | ICD-10-CM | POA: Diagnosis not present

## 2015-01-07 DIAGNOSIS — F17210 Nicotine dependence, cigarettes, uncomplicated: Secondary | ICD-10-CM | POA: Diagnosis not present

## 2015-01-07 DIAGNOSIS — M542 Cervicalgia: Secondary | ICD-10-CM | POA: Insufficient documentation

## 2015-01-07 DIAGNOSIS — J020 Streptococcal pharyngitis: Secondary | ICD-10-CM

## 2015-01-07 DIAGNOSIS — G43009 Migraine without aura, not intractable, without status migrainosus: Secondary | ICD-10-CM

## 2015-01-07 DIAGNOSIS — B9689 Other specified bacterial agents as the cause of diseases classified elsewhere: Secondary | ICD-10-CM

## 2015-01-07 LAB — URINALYSIS, ROUTINE W REFLEX MICROSCOPIC
Bilirubin Urine: NEGATIVE
Glucose, UA: NEGATIVE mg/dL
Hgb urine dipstick: NEGATIVE
Ketones, ur: NEGATIVE mg/dL
LEUKOCYTES UA: NEGATIVE
NITRITE: NEGATIVE
Protein, ur: NEGATIVE mg/dL
SPECIFIC GRAVITY, URINE: 1.024 (ref 1.005–1.030)
pH: 6.5 (ref 5.0–8.0)

## 2015-01-07 LAB — WET PREP, GENITAL
Sperm: NONE SEEN
Trich, Wet Prep: NONE SEEN

## 2015-01-07 LAB — GC/CHLAMYDIA PROBE AMP (~~LOC~~) NOT AT ARMC
Chlamydia: NEGATIVE
Neisseria Gonorrhea: NEGATIVE

## 2015-01-07 LAB — HIV ANTIBODY (ROUTINE TESTING W REFLEX): HIV Screen 4th Generation wRfx: NONREACTIVE

## 2015-01-07 LAB — RPR: RPR Ser Ql: NONREACTIVE

## 2015-01-07 LAB — PREGNANCY, URINE: PREG TEST UR: NEGATIVE

## 2015-01-07 LAB — RAPID STREP SCREEN (MED CTR MEBANE ONLY): Streptococcus, Group A Screen (Direct): POSITIVE — AB

## 2015-01-07 MED ORDER — PENICILLIN G BENZATHINE 1200000 UNIT/2ML IM SUSP
1.2000 10*6.[IU] | Freq: Once | INTRAMUSCULAR | Status: AC
  Administered 2015-01-07: 1.2 10*6.[IU] via INTRAMUSCULAR
  Filled 2015-01-07 (×3): qty 2

## 2015-01-07 MED ORDER — HYDROCODONE-ACETAMINOPHEN 7.5-325 MG/15ML PO SOLN: 15.0000 mL | Freq: Four times a day (QID) | ORAL | Status: DC | PRN

## 2015-01-07 MED ORDER — DIPHENHYDRAMINE HCL 50 MG/ML IJ SOLN
25.0000 mg | Freq: Once | INTRAMUSCULAR | Status: AC
  Administered 2015-01-07: 25 mg via INTRAVENOUS
  Filled 2015-01-07: qty 1

## 2015-01-07 MED ORDER — KETOROLAC TROMETHAMINE 30 MG/ML IJ SOLN
30.0000 mg | Freq: Once | INTRAMUSCULAR | Status: AC
  Administered 2015-01-07: 30 mg via INTRAVENOUS
  Filled 2015-01-07: qty 1

## 2015-01-07 MED ORDER — METOCLOPRAMIDE HCL 5 MG/ML IJ SOLN
10.0000 mg | Freq: Once | INTRAMUSCULAR | Status: AC
  Administered 2015-01-07: 10 mg via INTRAVENOUS
  Filled 2015-01-07: qty 2

## 2015-01-07 MED ORDER — METRONIDAZOLE 500 MG PO TABS: 500.0000 mg | ORAL_TABLET | Freq: Two times a day (BID) | ORAL | Status: DC

## 2015-01-07 MED ORDER — SODIUM CHLORIDE 0.9 % IV BOLUS (SEPSIS)
1000.0000 mL | Freq: Once | INTRAVENOUS | Status: AC
  Administered 2015-01-07: 1000 mL via INTRAVENOUS

## 2015-01-07 NOTE — ED Notes (Signed)
Knapp, MD at bedside.  

## 2015-01-07 NOTE — Discharge Instructions (Signed)
Drink plenty of fluids. You can take the hydrocodone liquid for pain with ibuprofen 600 mg 4 times a day (you can take the liquid children's if you are having trouble swallowing the pill). When the hydrocodone is gone, you can take acetaminophen 1000 mg 4 times a day for pain (it is also children's liquid if needed). Take the metronidazole until gone. Recheck if you are unable to swallow, have trouble breathing, get a severe headache or feel worse.    Bacterial Vaginosis Bacterial vaginosis is a vaginal infection that occurs when the normal balance of bacteria in the vagina is disrupted. It results from an overgrowth of certain bacteria. This is the most common vaginal infection in women of childbearing age. Treatment is important to prevent complications, especially in pregnant women, as it can cause a premature delivery. CAUSES  Bacterial vaginosis is caused by an increase in harmful bacteria that are normally present in smaller amounts in the vagina. Several different kinds of bacteria can cause bacterial vaginosis. However, the reason that the condition develops is not fully understood. RISK FACTORS Certain activities or behaviors can put you at an increased risk of developing bacterial vaginosis, including:  Having a new sex partner or multiple sex partners.  Douching.  Using an intrauterine device (IUD) for contraception. Women do not get bacterial vaginosis from toilet seats, bedding, swimming pools, or contact with objects around them. SIGNS AND SYMPTOMS  Some women with bacterial vaginosis have no signs or symptoms. Common symptoms include:  Grey vaginal discharge.  A fishlike odor with discharge, especially after sexual intercourse.  Itching or burning of the vagina and vulva.  Burning or pain with urination. DIAGNOSIS  Your health care provider will take a medical history and examine the vagina for signs of bacterial vaginosis. A sample of vaginal fluid may be taken. Your health  care provider will look at this sample under a microscope to check for bacteria and abnormal cells. A vaginal pH test may also be done.  TREATMENT  Bacterial vaginosis may be treated with antibiotic medicines. These may be given in the form of a pill or a vaginal cream. A second round of antibiotics may be prescribed if the condition comes back after treatment. Because bacterial vaginosis increases your risk for sexually transmitted diseases, getting treated can help reduce your risk for chlamydia, gonorrhea, HIV, and herpes. HOME CARE INSTRUCTIONS   Only take over-the-counter or prescription medicines as directed by your health care provider.  If antibiotic medicine was prescribed, take it as directed. Make sure you finish it even if you start to feel better.  Tell all sexual partners that you have a vaginal infection. They should see their health care provider and be treated if they have problems, such as a mild rash or itching.  During treatment, it is important that you follow these instructions:  Avoid sexual activity or use condoms correctly.  Do not douche.  Avoid alcohol as directed by your health care provider.  Avoid breastfeeding as directed by your health care provider. SEEK MEDICAL CARE IF:   Your symptoms are not improving after 3 days of treatment.  You have increased discharge or pain.  You have a fever. MAKE SURE YOU:   Understand these instructions.  Will watch your condition.  Will get help right away if you are not doing well or get worse. FOR MORE INFORMATION  Centers for Disease Control and Prevention, Division of STD Prevention: [employer] American Sexual Health Association (ASHA): www.ashastd.org    This  information is not intended to replace advice given to you by your health care provider. Make sure you discuss any questions you have with your health care provider.   Document Released: 01/09/2005 Document Revised: 01/30/2014 Document Reviewed:  08/21/2012 Elsevier Interactive Patient Education 2016 [employer]  Strep Throat Strep throat is a bacterial infection of the throat. Your health care provider may call the infection tonsillitis or pharyngitis, depending on whether there is swelling in the tonsils or at the back of the throat. Strep throat is most common during the cold months of the year in children who are 685-25 years of age, but it can happen during any season in people of any age. This infection is spread from person to person (contagious) through coughing, sneezing, or close contact. CAUSES Strep throat is caused by the bacteria called Streptococcus pyogenes. RISK FACTORS This condition is more likely to develop in:  People who spend time in crowded places where the infection can spread easily.  People who have close contact with someone who has strep throat. SYMPTOMS Symptoms of this condition include:  Fever or chills.   Redness, swelling, or pain in the tonsils or throat.  Pain or difficulty when swallowing.  White or yellow spots on the tonsils or throat.  Swollen, tender glands in the neck or under the jaw.  Red rash all over the body (rare). DIAGNOSIS This condition is diagnosed by performing a rapid strep test or by taking a swab of your throat (throat culture test). Results from a rapid strep test are usually ready in a few minutes, but throat culture test results are available after one or two days. TREATMENT This condition is treated with antibiotic medicine. HOME CARE INSTRUCTIONS Medicines  Take over-the-counter and prescription medicines only as told by your health care provider.  Take your antibiotic as told by your health care provider. Do not stop taking the antibiotic even if you start to feel better.  Have family members who also have a sore throat or fever tested for strep throat. They may need antibiotics if they have the strep infection. Eating and Drinking  Do not share food,  drinking cups, or personal items that could cause the infection to spread to other people.  If swallowing is difficult, try eating soft foods until your sore throat feels better.  Drink enough fluid to keep your urine clear or pale yellow. General Instructions  Gargle with a salt-water mixture 3-4 times per day or as needed. To make a salt-water mixture, completely dissolve -1 tsp of salt in 1 cup of warm water.  Make sure that all household members wash their hands well.  Get plenty of rest.  Stay home from school or work until you have been taking antibiotics for 24 hours.  Keep all follow-up visits as told by your health care provider. This is important. SEEK MEDICAL CARE IF:  The glands in your neck continue to get bigger.  You develop a rash, cough, or earache.  You cough up a thick liquid that is green, yellow-brown, or bloody.  You have pain or discomfort that does not get better with medicine.  Your problems seem to be getting worse rather than better.  You have a fever. SEEK IMMEDIATE MEDICAL CARE IF:  You have new symptoms, such as vomiting, severe headache, stiff or painful neck, chest pain, or shortness of breath.  You have severe throat pain, drooling, or changes in your voice.  You have swelling of the neck, or the skin  on the neck becomes red and tender.  You have signs of dehydration, such as fatigue, dry mouth, and decreased urination.  You become increasingly sleepy, or you cannot wake up completely.  Your joints become red or painful.   This information is not intended to replace advice given to you by your health care provider. Make sure you discuss any questions you have with your health care provider.   Document Released: 01/07/2000 Document Revised: 09/30/2014 Document Reviewed: 05/04/2014 Elsevier Interactive Patient Education 2016 [employer].  Migraine Headache A migraine headache is very bad, throbbing pain on one or both sides of your  head. Talk to your doctor about what things may bring on (trigger) your migraine headaches. HOME CARE  Only take medicines as told by your doctor.  Lie down in a dark, quiet room when you have a migraine.  Keep a journal to find out if certain things bring on migraine headaches. For example, write down:  What you eat and drink.  How much sleep you get.  Any change to your diet or medicines.  Lessen how much alcohol you drink.  Quit smoking if you smoke.  Get enough sleep.  Lessen any stress in your life.  Keep lights dim if bright lights bother you or make your migraines worse. GET HELP RIGHT AWAY IF:   Your migraine becomes really bad.  You have a fever.  You have a stiff neck.  You have trouble seeing.  Your muscles are weak, or you lose muscle control.  You lose your balance or have trouble walking.  You feel like you will pass out (faint), or you pass out.  You have really bad symptoms that are different than your first symptoms. MAKE SURE YOU:   Understand these instructions.  Will watch your condition.  Will get help right away if you are not doing well or get worse.   This information is not intended to replace advice given to you by your health care provider. Make sure you discuss any questions you have with your health care provider.   Document Released: 10/19/2007 Document Revised: 04/03/2011 Document Reviewed: 09/16/2012 Elsevier Interactive Patient Education [employer]

## 2015-01-07 NOTE — ED Notes (Signed)
Pt c/o body aches, vaginal itching, pelvic pain, and sore throat x 1 week. Pt alert and oriented x4 and in no acute distress.

## 2015-01-07 NOTE — ED Provider Notes (Signed)
CSN: [id number]     Arrival date & time 01/07/15  5784 History  By signing my name below, I, Emmanuella Mensah, attest that this documentation has been prepared under the direction and in the presence of Devoria Albe, MD at [phone]. Electronically Signed: Angelene Giovanni, ED Scribe. 01/07/2015. 3:53 AM.    Chief Complaint  Patient presents with  . Vaginal Itching  . Generalized Body Aches  . Sore Throat   The history is provided by the patient. No language interpreter was used.   HPI Comments: Alicia Branch is a 24 y.o. female with a hx of anemia who presents to the Emergency Department complaining of gradually worsening intermittent moderate throbbing HA with  neck pain that radiates toward her bilateral eyes onset 5 days ago. She reports associated photophobia, sensitivity to noise, mild anterior neck swelling, sore throat when swallowing, subjective fever, chills, and a mild cough. She also c/o suprapubic pain, vaginal discomfort with urination, and frequency. She states that she has vaginal discharge and 4 daily episodes of diarrhea onset 3 days ago after her menstrual cycle ended. She denies any rhinorrhea, congestion, n/v, or ear pain but states her ears hear "funny". She states that her LNMP started on 12/30/14 and normally lasts for 5 days. She denies any sick contacts.  . She denies use of birth control. Has the same sexual partner.   PCP none   Past Medical History  Diagnosis Date  . Anemia    Past Surgical History  Procedure Laterality Date  . Cesarean section    . Tubal ligation  10/05/2011    Procedure: POST PARTUM TUBAL LIGATION;  Surgeon: Tereso Newcomer, MD;  Location: [facility];  Service: Gynecology;  Laterality: Bilateral;  . Cholecystectomy N/A 06/02/2014    Procedure: LAPAROSCOPIC CHOLECYSTECTOMY WITH INTRAOPERATIVE CHOLANGIOGRAM;  Surgeon: Luretha Murphy, MD;  Location: [facility];  Service: General;  Laterality: N/A;   Family History  Problem Relation Age of Onset  .  Hypertension Mother   . Diabetes Maternal Grandfather    Social History  Substance Use Topics  . Smoking status: Current Some Day Smoker -- 0.50 packs/day    Types: Cigarettes    Last Attempt to Quit: 02/09/2014  . Smokeless tobacco: Never Used  . Alcohol Use: Yes     Comment: socially   Employed Smokes 2-3 cigs a day  OB History    Gravida Para Term Preterm AB TAB SAB Ectopic Multiple Living   Review of Systems  Constitutional: Positive for fever and chills.  HENT: Negative for congestion, ear pain and rhinorrhea.   Eyes: Positive for photophobia and pain.  Respiratory: Positive for cough.   Gastrointestinal: Positive for abdominal pain and diarrhea. Negative for nausea and vomiting.  Genitourinary: Positive for frequency. Negative for dysuria and hematuria.  Musculoskeletal: Positive for neck pain.  Neurological: Positive for light-headedness and headaches.  All other systems reviewed and are negative.     Allergies  Review of patient's allergies indicates no known allergies.  Home Medications   Prior to Admission medications   Medication Sig Start Date End Date Taking? Authorizing Provider  Aspirin-Salicylamide-Caffeine (BC HEADACHE PO) Take 1 packet by mouth daily as needed (for headache).   Yes Historical Provider, MD  HYDROcodone-acetaminophen (HYCET) 7.5-325 mg/15 ml solution Take 15 mLs by mouth 4 (four) times daily as needed for moderate pain. 01/07/15 01/07/16  Devoria Albe, MD  metroNIDAZOLE (FLAGYL) 500 MG  tablet Take 1 tablet (500 mg total) by mouth 2 (two) times daily. 01/07/15   Devoria AlbeIva Jasman Murri, MD   BP 101/53 mmHg  Pulse 72  Temp(Src) 98 F (36.7 C) (Oral)  Resp 16  Ht 5\' 3"  (1.6 m)  Wt 119 lb (53.978 kg)  BMI 21.09 kg/m2  SpO2 99%  LMP 01/05/2015  Vital signs normal   Physical Exam  Constitutional: She is oriented to person, place, and time. She appears well-developed and well-nourished.  Non-toxic appearance. She does not appear  ill. No distress.  HENT:  Head: Normocephalic and atraumatic.  Right Ear: External ear normal.  Left Ear: Tympanic membrane, external ear and ear canal normal.  Nose: Nose normal. No mucosal edema or rhinorrhea.  Mouth/Throat: Oropharynx is clear and moist and mucous membranes are normal. No dental abscesses or uvula swelling.  Minimal redness of posterior pharynx   Eyes: Conjunctivae and EOM are normal. Pupils are equal, round, and reactive to light.  Neck: Full passive range of motion without pain. Neck supple.  Pain in her anterior neck with ROM of neck.   Cardiovascular: Normal rate, regular rhythm and normal heart sounds.  Exam reveals no gallop and no friction rub.   No murmur heard. Pulmonary/Chest: Effort normal and breath sounds normal. No respiratory distress. She has no wheezes. She has no rhonchi. She has no rales. She exhibits no tenderness and no crepitus.  Abdominal: Soft. Normal appearance and bowel sounds are normal. She exhibits no distension. There is no tenderness. There is no rebound and no guarding.  Mostly tender over the suprapubic area with mild tenderness over the RLQ and RUQ s/p cholecystitis   Genitourinary:  Normal external genitalia, small white discharge, nontender normal sized uterus, nontender normal sized adenexa bilaterally  Musculoskeletal: Normal range of motion. She exhibits no edema or tenderness.  Moves all extremities well.   Neurological: She is alert and oriented to person, place, and time. She has normal strength. No cranial nerve deficit.  Skin: Skin is warm, dry and intact. No rash noted. No erythema. No pallor.  Psychiatric: She has a normal mood and affect. Her speech is normal and behavior is normal. Her mood appears not anxious.  Nursing note and vitals reviewed.   ED Course  Procedures (including critical care time)  Medications  sodium chloride 0.9 % bolus 1,000 mL (0 mLs Intravenous Stopped 01/07/15 0627)  metoCLOPramide (REGLAN)  injection 10 mg (10 mg Intravenous Given 01/07/15 0417)  diphenhydrAMINE (BENADRYL) injection 25 mg (25 mg Intravenous Given 01/07/15 0412)  ketorolac (TORADOL) 30 MG/ML injection 30 mg (30 mg Intravenous Given 01/07/15 0456)  penicillin g benzathine (BICILLIN LA) 1200000 UNIT/2ML injection 1.2 Million Units (1.2 Million Units Intramuscular Given 01/07/15 0627)    DIAGNOSTIC STUDIES: Oxygen Saturation is 99% on RA, normal by my interpretation.    COORDINATION OF CARE: 3:45 AM- Pt advised of plan for treatment and pt agrees. Pt will receive a pelvic exam and IV fluids. She was given a migraine cocktail for her headache, which has the characteristics of a migraine.   06:00 Pt states her headache is gone. She was told her strept is positive, she has opted for the bicillin LA injection. She will be treated for her BV with oral flagyl.    Labs Review Results for orders placed or performed during the hospital encounter of 01/07/15  Rapid strep screen  Result Value Ref Range   Streptococcus, Group A Screen (Direct) POSITIVE (A) NEGATIVE  Wet prep, genital  Result  Value Ref Range   Yeast Wet Prep HPF POC RARE (A) NONE SEEN   Trich, Wet Prep NONE SEEN NONE SEEN   Clue Cells Wet Prep HPF POC FEW (A) NONE SEEN   WBC, Wet Prep HPF POC FEW (A) NONE SEEN   Sperm NONE SEEN   Urinalysis, Routine w reflex microscopic (not at [facility])  Result Value Ref Range   Color, Urine YELLOW YELLOW   APPearance CLEAR CLEAR   Specific Gravity, Urine 1.024 1.005 - 1.030   pH 6.5 5.0 - 8.0   Glucose, UA NEGATIVE NEGATIVE mg/dL   Hgb urine dipstick NEGATIVE NEGATIVE   Bilirubin Urine NEGATIVE NEGATIVE   Ketones, ur NEGATIVE NEGATIVE mg/dL   Protein, ur NEGATIVE NEGATIVE mg/dL   Nitrite NEGATIVE NEGATIVE   Leukocytes, UA NEGATIVE NEGATIVE  Pregnancy, urine  Result Value Ref Range   Preg Test, Ur NEGATIVE NEGATIVE    Laboratory interpretation all normal except positive for strep, positive for BV    Imaging  Review No results found.   Devoria Albe, MD has personally reviewed and evaluated these images and lab results as part of her medical decision-making.  MDM   Final diagnoses:  Migraine without aura and without status migrainosus, not intractable  Streptococcal pharyngitis  BV (bacterial vaginosis)   New Prescriptions   HYDROCODONE-ACETAMINOPHEN (HYCET) 7.5-325 MG/15 ML SOLUTION    Take 15 mLs by mouth 4 (four) times daily as needed for moderate pain.   METRONIDAZOLE (FLAGYL) 500 MG TABLET    Take 1 tablet (500 mg total) by mouth 2 (two) times daily.    Plan discharge   Devoria Albe, MD, FACEP   I personally performed the services described in this documentation, which was scribed in my presence. The recorded information has been reviewed and considered.  Devoria Albe, MD, Concha Pyo, MD 01/07/15 [phone]

## 2015-05-19 DIAGNOSIS — F17210 Nicotine dependence, cigarettes, uncomplicated: Secondary | ICD-10-CM | POA: Insufficient documentation

## 2015-05-19 DIAGNOSIS — Z79899 Other long term (current) drug therapy: Secondary | ICD-10-CM | POA: Insufficient documentation

## 2015-05-19 DIAGNOSIS — G43109 Migraine with aura, not intractable, without status migrainosus: Secondary | ICD-10-CM | POA: Insufficient documentation

## 2015-05-19 DIAGNOSIS — Z791 Long term (current) use of non-steroidal anti-inflammatories (NSAID): Secondary | ICD-10-CM | POA: Insufficient documentation

## 2015-05-19 DIAGNOSIS — R51 Headache: Secondary | ICD-10-CM | POA: Diagnosis present

## 2015-05-20 ENCOUNTER — Encounter (HOSPITAL_COMMUNITY): Payer: Self-pay | Admitting: Emergency Medicine

## 2015-05-20 ENCOUNTER — Emergency Department (HOSPITAL_COMMUNITY)
Admission: EM | Admit: 2015-05-20 | Discharge: 2015-05-20 | Disposition: A | Discharge status: Home / Self Care | Payer: Medicaid Other | Attending: Emergency Medicine | Admitting: Emergency Medicine

## 2015-05-20 DIAGNOSIS — G43109 Migraine with aura, not intractable, without status migrainosus: Secondary | ICD-10-CM

## 2015-05-20 MED ORDER — OXYMETAZOLINE HCL 0.05 % NA SOLN
1.0000 | Freq: Once | NASAL | Status: AC
  Administered 2015-05-20: 1 via NASAL
  Filled 2015-05-20: qty 15

## 2015-05-20 MED ORDER — DEXAMETHASONE SODIUM PHOSPHATE 10 MG/ML IJ SOLN
10.0000 mg | Freq: Once | INTRAMUSCULAR | Status: AC
  Administered 2015-05-20: 10 mg via INTRAVENOUS
  Filled 2015-05-20: qty 1

## 2015-05-20 MED ORDER — KETOROLAC TROMETHAMINE 15 MG/ML IJ SOLN
15.0000 mg | Freq: Once | INTRAMUSCULAR | Status: AC
  Administered 2015-05-20: 15 mg via INTRAVENOUS
  Filled 2015-05-20: qty 1

## 2015-05-20 MED ORDER — IBUPROFEN 600 MG PO TABS: 600.0000 mg | ORAL_TABLET | Freq: Four times a day (QID) | ORAL | Status: DC | PRN

## 2015-05-20 MED ORDER — METOCLOPRAMIDE HCL 5 MG/ML IJ SOLN
10.0000 mg | Freq: Once | INTRAMUSCULAR | Status: AC
  Administered 2015-05-20: 10 mg via INTRAVENOUS
  Filled 2015-05-20: qty 2

## 2015-05-20 NOTE — Discharge Instructions (Signed)
We saw you in the ER for headaches. There appears to be no evidence of infection, bleeds or tumors based on our exam and results.  Please take motrin round the clock for the next 6 hours, and take other meds prescribed only for break through pain. See your doctor if the pain persists, as you might need better medications or further workup.    Migraine Headache A migraine headache is an intense, throbbing pain on one or both sides of your head. A migraine can last for 30 minutes to several hours. CAUSES  The exact cause of a migraine headache is not always known. However, a migraine may be caused when nerves in the brain become irritated and release chemicals that cause inflammation. This causes pain. Certain things may also trigger migraines, such as:  Alcohol.  Smoking.  Stress.  Menstruation.  Aged cheeses.  Foods or drinks that contain nitrates, glutamate, aspartame, or tyramine.  Lack of sleep.  Chocolate.  Caffeine.  Hunger.  Physical exertion.  Fatigue.  Medicines used to treat chest pain (nitroglycerine), birth control pills, estrogen, and some blood pressure medicines. SIGNS AND SYMPTOMS  Pain on one or both sides of your head.  Pulsating or throbbing pain.  Severe pain that prevents daily activities.  Pain that is aggravated by any physical activity.  Nausea, vomiting, or both.  Dizziness.  Pain with exposure to bright lights, loud noises, or activity.  General sensitivity to bright lights, loud noises, or smells. Before you get a migraine, you may get warning signs that a migraine is coming (aura). An aura may include:  Seeing flashing lights.  Seeing bright spots, halos, or zigzag lines.  Having tunnel vision or blurred vision.  Having feelings of numbness or tingling.  Having trouble talking.  Having muscle weakness. DIAGNOSIS  A migraine headache is often diagnosed based on:  Symptoms.  Physical exam.  A CT scan or MRI of your  head. These imaging tests cannot diagnose migraines, but they can help rule out other causes of headaches. TREATMENT Medicines may be given for pain and nausea. Medicines can also be given to help prevent recurrent migraines.  HOME CARE INSTRUCTIONS  Only take over-the-counter or prescription medicines for pain or discomfort as directed by your health care provider. The use of long-term narcotics is not recommended.  Lie down in a dark, quiet room when you have a migraine.  Keep a journal to find out what may trigger your migraine headaches. For example, write down:  What you eat and drink.  How much sleep you get.  Any change to your diet or medicines.  Limit alcohol consumption.  Quit smoking if you smoke.  Get 7-9 hours of sleep, or as recommended by your health care provider.  Limit stress.  Keep lights dim if bright lights bother you and make your migraines worse. SEEK IMMEDIATE MEDICAL CARE IF:   Your migraine becomes severe.  You have a fever.  You have a stiff neck.  You have vision loss.  You have muscular weakness or loss of muscle control.  You start losing your balance or have trouble walking.  You feel faint or pass out.  You have severe symptoms that are different from your first symptoms. MAKE SURE YOU:   Understand these instructions.  Will watch your condition.  Will get help right away if you are not doing well or get worse.   This information is not intended to replace advice given to you by your health care provider.  Make sure you discuss any questions you have with your health care provider.   Document Released: 01/09/2005 Document Revised: 01/30/2014 Document Reviewed: 09/16/2012 Elsevier Interactive Patient Education [employer]

## 2015-05-20 NOTE — ED Provider Notes (Signed)
CSN: [id number]     Arrival date & time 05/19/15  2358 History  By signing my name below, I, Bethel BornBritney McCollum, attest that this documentation has been prepared under the direction and in the presence of Derwood KaplanAnkit Klark Vanderhoef, MD. Electronically Signed: Bethel BornBritney McCollum, ED Scribe. 05/20/2015. 3:51 AM   Chief Complaint  Patient presents with  . Headache   The history is provided by the patient. No language interpreter was used.   Alicia Branch is a 25 y.o. female who presents to the Emergency Department complaining of a constant,  sharp, throbbing/sharp, 8/10 in severity, posterior headache with onset 3 days ago.  Tylenol has provided insufficient pain relief at home. She has no history of diagnosed migraines but notes that she always has headaches with menses. This headache is typical of her usual headaches but has lasted longer. Associated symptoms include photophobia, nasal congestion, and 1 day of scratchy throat pain. Pt denies ear pain, post-nasal drip, nausea, and vomiting. She has no PCP. Pt is not on birth control but denies risk of pregnancy. No family history of sudden death in young people,  brain aneurysm, or brain tumors.   Past Medical History  Diagnosis Date  . Anemia    Past Surgical History  Procedure Laterality Date  . Cesarean section    . Tubal ligation  10/05/2011    Procedure: POST PARTUM TUBAL LIGATION;  Surgeon: Tereso NewcomerUgonna A Anyanwu, MD;  Location: [facility];  Service: Gynecology;  Laterality: Bilateral;  . Cholecystectomy N/A 06/02/2014    Procedure: LAPAROSCOPIC CHOLECYSTECTOMY WITH INTRAOPERATIVE CHOLANGIOGRAM;  Surgeon: Luretha MurphyMatthew Martin, MD;  Location: [facility];  Service: General;  Laterality: N/A;   Family History  Problem Relation Age of Onset  . Hypertension Mother   . Diabetes Maternal Grandfather    Social History  Substance Use Topics  . Smoking status: Current Some Day Smoker -- 0.50 packs/day    Types: Cigarettes    Last Attempt to Quit: 02/09/2014  . Smokeless  tobacco: Never Used  . Alcohol Use: No     Comment: socially   OB History    Gravida Para Term Preterm AB TAB SAB Ectopic Multiple Living   5 4 4  1 1    4      Review of Systems 10 Systems reviewed and all are negative for acute change except as noted in the HPI.   Allergies  Review of patient's allergies indicates no known allergies.  Home Medications   Prior to Admission medications   Medication Sig Start Date End Date Taking? Authorizing Provider  acetaminophen (TYLENOL) 500 MG tablet Take 1,000 mg by mouth every 6 (six) hours as needed for mild pain, moderate pain or headache.   Yes Historical Provider, MD  ibuprofen (ADVIL,MOTRIN) 600 MG tablet Take 1 tablet (600 mg total) by mouth every 6 (six) hours as needed. 05/20/15   Jacari Iannello Rhunette CroftNanavati, MD   BP 116/77 mmHg  Pulse 75  Temp(Src) 98.3 F (36.8 C) (Oral)  Resp 19  Ht 5\' 1"  (1.549 m)  Wt 143 lb (64.864 kg)  BMI 27.03 kg/m2  SpO2 98%  LMP 05/16/2015 (Exact Date) Physical Exam  Constitutional: She is oriented to person, place, and time. She appears well-developed and well-nourished. No distress.  HENT:  Head: Normocephalic and atraumatic.  Posterior pharynx is clear. No tonsillar enlargement or exudate.  Eyes: EOM are normal.  Neck: Normal range of motion.  Cardiovascular: Normal rate, regular rhythm and normal heart sounds.   Pulmonary/Chest: Effort normal and breath  sounds normal.  Abdominal: Soft. She exhibits no distension. There is no tenderness.  Musculoskeletal: Normal range of motion.  Lymphadenopathy:    She has cervical adenopathy (mild).  Neurological: She is alert and oriented to person, place, and time.  Skin: Skin is warm and dry.  Psychiatric: She has a normal mood and affect. Judgment normal.  Nursing note and vitals reviewed.   ED Course  Procedures (including critical care time) DIAGNOSTIC STUDIES: Oxygen Saturation is 98% on RA,  normal by my interpretation.    COORDINATION OF CARE: 2:07 AM  Discussed treatment plan which includes headache cocktail and Afrin  with pt at bedside and pt agreed to plan.  3:51 AM I re-evaluated the patient and her headache has resolved after the migraine cocktail.   Labs Review Labs Reviewed - No data to display  Imaging Review No results found.   EKG Interpretation None      MDM   Final diagnoses:  Migraine equivalent   I personally performed the services described in this documentation, which was scribed in my presence. The recorded information has been reviewed and is accurate.  PT comes in with cc of headache. Pt has never been diagnosed with migraines - but she has hx of similar as headaches as the one right now, and there is some migraine type features. No neurologic deficits on exam. Pt has no risk factors for brain AN, bleeds, tumors. We dont think she has thrombosis or infection. We will start with meds and reassess. Anticipate d/c.  Derwood Kaplan, MD 05/21/15 2202

## 2015-05-20 NOTE — ED Notes (Signed)
Pt is c/o headache for 3 days  Pt states the pain is in the back of her head and neck Pt states today she has had nasal congestion, itchy throat, watery eyes  Pt states she has taken tylenol pm and extra strength tylenol without relief

## 2015-05-27 ENCOUNTER — Ambulatory Visit: Payer: Self-pay | Admitting: Neurology

## 2015-06-07 ENCOUNTER — Telehealth: Payer: Self-pay | Admitting: *Deleted

## 2015-06-07 ENCOUNTER — Ambulatory Visit: Payer: Self-pay | Admitting: Neurology

## 2015-06-07 NOTE — Telephone Encounter (Signed)
No showed new patient appointment. 

## 2015-06-08 ENCOUNTER — Encounter: Payer: Self-pay | Admitting: Neurology

## 2015-06-15 ENCOUNTER — Ambulatory Visit: Payer: Medicaid Other | Admitting: Neurology

## 2015-08-23 ENCOUNTER — Ambulatory Visit: Payer: Medicaid Other | Admitting: Neurology

## 2019-09-14 ENCOUNTER — Ambulatory Visit (INDEPENDENT_AMBULATORY_CARE_PROVIDER_SITE_OTHER): Payer: Medicaid Other

## 2019-09-14 ENCOUNTER — Encounter (HOSPITAL_COMMUNITY): Payer: Self-pay

## 2019-09-14 ENCOUNTER — Ambulatory Visit (HOSPITAL_COMMUNITY)
Admission: EM | Admit: 2019-09-14 | Discharge: 2019-09-14 | Disposition: A | Discharge status: Home / Self Care | Payer: Medicaid Other | Attending: Emergency Medicine | Admitting: Emergency Medicine

## 2019-09-14 ENCOUNTER — Other Ambulatory Visit: Payer: Self-pay

## 2019-09-14 DIAGNOSIS — S81012A Laceration without foreign body, left knee, initial encounter: Secondary | ICD-10-CM

## 2019-09-14 DIAGNOSIS — Z23 Encounter for immunization: Secondary | ICD-10-CM | POA: Diagnosis not present

## 2019-09-14 DIAGNOSIS — S8992XA Unspecified injury of left lower leg, initial encounter: Secondary | ICD-10-CM | POA: Diagnosis not present

## 2019-09-14 DIAGNOSIS — W25XXXA Contact with sharp glass, initial encounter: Secondary | ICD-10-CM | POA: Diagnosis not present

## 2019-09-14 MED ORDER — IBUPROFEN 800 MG PO TABS: 800.0000 mg | ORAL_TABLET | Freq: Three times a day (TID) | ORAL | 0 refills | Status: DC

## 2019-09-14 MED ORDER — LIDOCAINE HCL 2 % IJ SOLN
INTRAMUSCULAR | Status: AC
  Filled 2019-09-14: qty 20

## 2019-09-14 MED ORDER — TETANUS-DIPHTH-ACELL PERTUSSIS 5-2.5-18.5 LF-MCG/0.5 IM SUSP
INTRAMUSCULAR | Status: AC
  Filled 2019-09-14: qty 0.5

## 2019-09-14 MED ORDER — HYDROCODONE-ACETAMINOPHEN 5-325 MG PO TABS: 1.0000 | ORAL_TABLET | Freq: Four times a day (QID) | ORAL | 0 refills | Status: DC | PRN

## 2019-09-14 MED ORDER — CEPHALEXIN 500 MG PO CAPS: 500.0000 mg | ORAL_CAPSULE | Freq: Four times a day (QID) | ORAL | 0 refills | Status: AC

## 2019-09-14 MED ORDER — TETANUS-DIPHTH-ACELL PERTUSSIS 5-2.5-18.5 LF-MCG/0.5 IM SUSP
0.5000 mL | Freq: Once | INTRAMUSCULAR | Status: AC
  Administered 2019-09-14: 0.5 mL via INTRAMUSCULAR

## 2019-09-14 NOTE — ED Notes (Signed)
NO answer when called for tx room

## 2019-09-14 NOTE — Discharge Instructions (Addendum)
Ice and elevate Use anti-inflammatories for pain/swelling. You may take up to 800 mg Ibuprofen every 8 hours with food. You may supplement Ibuprofen with Tylenol [phone] mg every 8 hours.  Hydrocodone for severe pain/nighttime- will cause drowsiness, use sparingly, do not drive/work after taking Begin keflex  4 times daily for 1 week to prevent infection  Follow up with ortho if continuing to have pain/knee problems for further evaluation of possible small foreign bodies in knee  WOUND CARE Please return in 7 days to have your stitches/staples removed or sooner if you have concerns.  Keep area clean and dry for 24 hours. Do not remove bandage, if applied.  After 24 hours, remove bandage and wash wound gently with mild soap and warm water. Reapply a new bandage after cleaning wound, if directed.  Continue daily cleansing with soap and water until stitches/staples are removed.  Do not apply any ointments or creams to the wound while stitches/staples are in place, as this may cause delayed healing.  Notify the office if you experience any of the following signs of infection: Swelling, redness, pus drainage, streaking, fever >101.0 F  Notify the office if you experience excessive bleeding that does not stop after 15-20 minutes of constant, firm pressure.

## 2019-09-14 NOTE — ED Triage Notes (Signed)
Pt c/o laceration to left knee area 2/2 cutting it on a mirror last night. Pt states she was running last night and fell into the mirror. Last tetanus unknown. Pt has applied peroxide to wound. No OTC meds taken.  Approx 4cm vertical laceration noted to patella area. Minimal subcu tissue visible; edges will approximate well. Moderate bleeding, Dsg placed

## 2019-09-15 NOTE — ED Provider Notes (Signed)
[facility]    CSN: [id number] Arrival date & time: 09/14/19  1547      History   Chief Complaint Chief Complaint  Patient presents with   Laceration    HPI Alicia Branch is a 29 y.o. female presenting today for evaluation of laceration.  Last night patient was running and accidentally fell onto a mirror causing it to shatter.  This caused laceration to her left knee.  Since she has had pain and swelling to her knee.  Denies prior injury to this knee.  Unsure of last tetanus.  HPI  Past Medical History:  Diagnosis Date   Anemia     Patient Active Problem List   Diagnosis Date Noted   Calculus of gallbladder with acute cholecystitis 06/02/2014   Acute cholecystitis with chronic cholecystitis 06/02/2014    Past Surgical History:  Procedure Laterality Date   CESAREAN SECTION     CHOLECYSTECTOMY N/A 06/02/2014   Procedure: LAPAROSCOPIC CHOLECYSTECTOMY WITH INTRAOPERATIVE CHOLANGIOGRAM;  Surgeon: Luretha Murphy, MD;  Location: [facility];  Service: General;  Laterality: N/A;   TUBAL LIGATION  10/05/2011   Procedure: POST PARTUM TUBAL LIGATION;  Surgeon: Tereso Newcomer, MD;  Location: [facility];  Service: Gynecology;  Laterality: Bilateral;    OB History    Gravida  5   Para  4   Term  4   Preterm      AB  1   Living  4     SAB      TAB  1   Ectopic      Multiple      Live Births  4            Home Medications    Prior to Admission medications   Medication Sig Start Date End Date Taking? Authorizing Provider  acetaminophen (TYLENOL) 500 MG tablet Take 1,000 mg by mouth every 6 (six) hours as needed for mild pain, moderate pain or headache.    [provider]  cephALEXin (KEFLEX) 500 MG capsule Take 1 capsule (500 mg total) by mouth 4 (four) times daily for 7 days. 09/14/19 09/21/19  Mirielle Byrum C, PA-C  HYDROcodone-acetaminophen (NORCO/VICODIN) 5-325 MG tablet Take 1-2 tablets by mouth every 6 (six) hours as needed for  severe pain. 09/14/19   Jance Siek C, PA-C  ibuprofen (ADVIL) 800 MG tablet Take 1 tablet (800 mg total) by mouth 3 (three) times daily. 09/14/19   Darlena Koval, Junius Creamer, PA-C    Family History Family History  Problem Relation Age of Onset   Hypertension Mother    Diabetes Maternal Grandfather     Social History Social History   Tobacco Use   Smoking status: Current Some Day Smoker    Packs/day: 0.50    Types: Cigarettes    Last attempt to quit: 02/09/2014    Years since quitting: 5.6   Smokeless tobacco: Never Used  Substance Use Topics   Alcohol use: No    Comment: socially   Drug use: No     Allergies   Patient has no known allergies.   Review of Systems Review of Systems  Constitutional: Negative for fatigue and fever.  Eyes: Negative for visual disturbance.  Respiratory: Negative for shortness of breath.   Cardiovascular: Negative for chest pain.  Gastrointestinal: Negative for abdominal pain, nausea and vomiting.  Musculoskeletal: Positive for arthralgias and joint swelling.  Skin: Positive for wound. Negative for color change and rash.  Neurological: Negative for dizziness, weakness, light-headedness and  headaches.     Physical Exam Triage Vital Signs ED Triage Vitals  Enc Vitals Group     BP 09/14/19 1702 119/73     Pulse Rate 09/14/19 1702 85     Resp 09/14/19 1702 18     Temp 09/14/19 1702 99 F (37.2 C)     Temp Source 09/14/19 1702 Oral     SpO2 09/14/19 1702 100 %     Weight --      Height --      Head Circumference --      Peak Flow --      Pain Score 09/14/19 1701 7     Pain Loc --      Pain Edu? --      Excl. in GC? --    No data found.  Updated Vital Signs BP 119/73 (BP Location: Left Arm)    Pulse 85    Temp 99 F (37.2 C) (Oral)    Resp 18    LMP 08/02/2019    SpO2 100%   Visual Acuity Right Eye Distance:   Left Eye Distance:   Bilateral Distance:    Right Eye Near:   Left Eye Near:    Bilateral Near:     Physical  Exam Vitals and nursing note reviewed.  Constitutional:      Appearance: She is well-developed.     Comments: No acute distress  HENT:     Head: Normocephalic and atraumatic.     Nose: Nose normal.  Eyes:     Conjunctiva/sclera: Conjunctivae normal.  Cardiovascular:     Rate and Rhythm: Normal rate.  Pulmonary:     Effort: Pulmonary effort is normal. No respiratory distress.  Abdominal:     General: There is no distension.  Musculoskeletal:        General: Normal range of motion.     Cervical back: Neck supple.     Comments: Left knee: 4 cm laceration noted to lateral aspect of knee by patella, no tendon involvement visualized, swelling noted to suprapatellar area, mild tenderness to palpation over patella and around wound, limited flexion due to discomfort  Skin:    General: Skin is warm and dry.  Neurological:     Mental Status: She is alert and oriented to person, place, and time.      UC Treatments / Results  Labs (all labs ordered are listed, but only abnormal results are displayed) Labs Reviewed - No data to display  EKG   Radiology DG Knee Complete 4 Views Left  Result Date: 09/14/2019 CLINICAL DATA:  Glass laceration injury to LEFT knee with pain. Initial encounter. EXAM: LEFT KNEE - COMPLETE 4+ VIEW COMPARISON:  None. FINDINGS: Faint linear hyperdensities in the soft tissues just above the patella may represent tiny foreign bodies. Small amount of gas in the suprapatellar soft tissues is compatible with soft tissue injury. No acute fracture, subluxation or dislocation. No joint effusion noted. IMPRESSION: 1. Soft tissue injury within the suprapatellar soft tissues with possible tiny foreign bodies. No acute bony abnormality or joint effusion. Electronically Signed   By: Harmon Pier M.D.   On: 09/14/2019 17:45    Procedures Laceration Repair  Date/Time: 09/14/2019 6:48 PM Performed by: Hatim Homann, Hamer C, PA-C Authorized by: Paislie Tessler, Junius Creamer, PA-C   Consent:     Consent obtained:  Verbal   Consent given by:  Patient   Risks discussed:  Pain, infection, need for additional repair and retained foreign body   Alternatives  discussed:  No treatment Anesthesia (see MAR for exact dosages):    Anesthesia method:  Local infiltration   Local anesthetic:  Lidocaine 2% w/o epi Laceration details:    Location:  Leg   Leg location:  L knee   Length (cm):  4   Depth (mm):  3 Repair type:    Repair type:  Simple Pre-procedure details:    Preparation:  Patient was prepped and draped in usual sterile fashion Exploration:    Hemostasis achieved with:  Direct pressure   Wound exploration: wound explored through full range of motion     Wound extent: no underlying fracture noted     Contaminated: no   Treatment:    Area cleansed with:  Shur-Clens   Amount of cleaning:  Standard   Irrigation solution:  Sterile water   Irrigation volume:  500   Irrigation method:  Syringe   Visualized foreign bodies/material removed: no   Skin repair:    Repair method:  Sutures   Suture size:  4-0   Suture material:  Prolene   Suture technique:  Simple interrupted   Number of sutures:  6 Approximation:    Approximation:  Close Post-procedure details:    Dressing:  Non-adherent dressing   Patient tolerance of procedure:  Tolerated well, no immediate complications   (including critical care time)  Medications Ordered in UC Medications  Tdap (BOOSTRIX) injection 0.5 mL (0.5 mLs Intramuscular Given 09/14/19 1836)    Initial Impression / Assessment and Plan / UC Course  I have reviewed the triage vital signs and the nursing notes.  Pertinent labs & imaging results that were available during my care of the patient were reviewed by me and considered in my medical decision making (see chart for details).     Wound sustained from fall, x-ray not suggestive of acute bony abnormality, does show possible tiny retained foreign bodies, given extent of wound, unable to  explore this further.  Recommended to follow-up with orthopedics for further evaluation of this.  Repaired wound, irrigated well and cleansed, less than 24 hours after initial injury. Placing on keflex for prophylaxis given possible foreign body and wound closure > 12 hours after initial injury.  Tetanus updated.  Discussed wound care.  Sutures to be removed in approximately 1 week.  Limit full flexion of knee to avoid further tension on knee.  Discussed strict return precautions. Patient verbalized understanding and is agreeable with plan.  Final Clinical Impressions(s) / UC Diagnoses   Final diagnoses:  Laceration of left knee, initial encounter  Injury of left knee, initial encounter     Discharge Instructions     Ice and elevate Use anti-inflammatories for pain/swelling. You may take up to 800 mg Ibuprofen every 8 hours with food. You may supplement Ibuprofen with Tylenol [phone] mg every 8 hours.  Hydrocodone for severe pain/nighttime- will cause drowsiness, use sparingly, do not drive/work after taking Begin keflex  4 times daily for 1 week to prevent infection  Follow up with ortho if continuing to have pain/knee problems for further evaluation of possible small foreign bodies in knee  WOUND CARE Please return in 7 days to have your stitches/staples removed or sooner if you have concerns.  Keep area clean and dry for 24 hours. Do not remove bandage, if applied.  After 24 hours, remove bandage and wash wound gently with mild soap and warm water. Reapply a new bandage after cleaning wound, if directed.  Continue daily cleansing with soap and water  until stitches/staples are removed.  Do not apply any ointments or creams to the wound while stitches/staples are in place, as this may cause delayed healing.  Notify the office if you experience any of the following signs of infection: Swelling, redness, pus drainage, streaking, fever >101.0 F  Notify the office if you experience  excessive bleeding that does not stop after 15-20 minutes of constant, firm pressure.   ED Prescriptions    Medication Sig Dispense Auth. Provider   ibuprofen (ADVIL) 800 MG tablet Take 1 tablet (800 mg total) by mouth 3 (three) times daily. 21 tablet Diva Lemberger C, PA-C   HYDROcodone-acetaminophen (NORCO/VICODIN) 5-325 MG tablet Take 1-2 tablets by mouth every 6 (six) hours as needed for severe pain. 6 tablet Audy Dauphine C, PA-C   cephALEXin (KEFLEX) 500 MG capsule Take 1 capsule (500 mg total) by mouth 4 (four) times daily for 7 days. 28 capsule Aleksa Collinsworth, [place]Hall[place] C, PA-C     I have reviewed the PDMP during this encounter.   Lew DawesWieters, Klover Priestly C, PA-C 09/15/19 1452

## 2020-08-20 ENCOUNTER — Encounter: Payer: Self-pay | Admitting: Gastroenterology

## 2020-09-29 ENCOUNTER — Ambulatory Visit: Payer: Medicaid Other | Admitting: Gastroenterology

## 2021-06-09 ENCOUNTER — Other Ambulatory Visit: Payer: Self-pay

## 2021-06-09 ENCOUNTER — Emergency Department (HOSPITAL_BASED_OUTPATIENT_CLINIC_OR_DEPARTMENT_OTHER)
Admission: EM | Admit: 2021-06-09 | Discharge: 2021-06-10 | Disposition: A | Discharge status: Home / Self Care | Payer: Medicaid Other | Attending: Emergency Medicine | Admitting: Emergency Medicine

## 2021-06-09 DIAGNOSIS — I862 Pelvic varices: Secondary | ICD-10-CM

## 2021-06-09 DIAGNOSIS — I82890 Acute embolism and thrombosis of other specified veins: Secondary | ICD-10-CM | POA: Diagnosis not present

## 2021-06-09 DIAGNOSIS — R109 Unspecified abdominal pain: Secondary | ICD-10-CM | POA: Diagnosis present

## 2021-06-09 LAB — PREGNANCY, URINE: Preg Test, Ur: NEGATIVE

## 2021-06-09 NOTE — ED Triage Notes (Signed)
Pt went to UC one month ago for same told "to drink more water". Pain on and off since then.

## 2021-06-09 NOTE — ED Triage Notes (Signed)
POV, pt sts that shes been having left sided flank and abd pain that started today. Denies NVD or painful urination. Alert and oriented x 4, ambulatory to triage.

## 2021-06-09 NOTE — ED Provider Notes (Signed)
[facility]  Provider Note  CSN: [id number] Arrival date & time: 06/09/21 2138  History Chief Complaint  Patient presents with   Flank Pain    Alicia Branch is a 31 y.o. female here for intermittent L flank and L sided abdominal pain for the last month. She was seen at an UC about a month ago but reports they didn't do any tests and just told her to drink more water. She had some cramping last week she attributed to her menses which resolved but pain returned this morning and has been persistent through the day. She reports it is in her L flank and radiates around to her L sided abdomen. No associated nausea or vomiting. No diarrhea. She has chronic loose stool s/p cholecystectomy. Denies dysuria or hematuria, reports her pain subsides after she urinates sometimes. No fevers.    Home Medications Prior to Admission medications   Medication Sig Start Date End Date Taking? Authorizing Provider  enoxaparin (LOVENOX) 40 MG/0.4ML injection Inject 0.75 mLs (75 mg total) into the skin every 12 (twelve) hours for 7 days. 06/10/21 06/17/21 Yes Truddie Hidden, MD  acetaminophen (TYLENOL) 500 MG tablet Take 1,000 mg by mouth every 6 (six) hours as needed for mild pain, moderate pain or headache.    [provider]  HYDROcodone-acetaminophen (NORCO/VICODIN) 5-325 MG tablet Take 1-2 tablets by mouth every 6 (six) hours as needed for severe pain. 09/14/19   Wieters, Hallie C, PA-C  ibuprofen (ADVIL) 800 MG tablet Take 1 tablet (800 mg total) by mouth 3 (three) times daily. 09/14/19   Wieters, Hallie C, PA-C     Allergies    Patient has no known allergies.   Review of Systems   Review of Systems Please see HPI for pertinent positives and negatives  Physical Exam BP 112/69 (BP Location: Right Arm)   Pulse 75   Temp 98.4 F (36.9 C)   Resp 18   Ht 5\' 1"  (1.549 m)   Wt 73.9 kg   LMP 06/02/2021   SpO2 97%   BMI 30.80 kg/m   Physical Exam Vitals and  nursing note reviewed.  Constitutional:      Appearance: Normal appearance.  HENT:     Head: Normocephalic and atraumatic.     Nose: Nose normal.     Mouth/Throat:     Mouth: Mucous membranes are moist.  Eyes:     Extraocular Movements: Extraocular movements intact.     Conjunctiva/sclera: Conjunctivae normal.  Cardiovascular:     Rate and Rhythm: Normal rate.  Pulmonary:     Effort: Pulmonary effort is normal.     Breath sounds: Normal breath sounds.  Abdominal:     General: Abdomen is flat.     Palpations: Abdomen is soft.     Tenderness: There is abdominal tenderness (LUQ/LLQ). There is no guarding or rebound.  Musculoskeletal:        General: No swelling. Normal range of motion.     Cervical back: Neck supple.  Skin:    General: Skin is warm and dry.  Neurological:     General: No focal deficit present.     Mental Status: She is alert.  Psychiatric:        Mood and Affect: Mood normal.    ED Results / Procedures / Treatments   EKG None  Procedures Procedures  Medications Ordered in the ED Medications  enoxaparin (LOVENOX) injection 75 mg (has no administration in time range)  iohexol (OMNIPAQUE)  300 MG/ML solution 100 mL (100 mLs Intravenous Contrast Given 06/10/21 0057)    Initial Impression and Plan  Patient here with persistent left sided abdominal/flank pain, Differential includes diverticulitis, renal colic, colitis, IBD. Will check labs and send for CT.   ED Course   Clinical Course as of 06/10/21 0156  Thu Jun 09, 2021  2358 hCG is neg.  [CS]  Fri Jun 10, 2021  0010 UA is clear.  [CS]  0013 CBC with mild anemia about at baseline. Normal WBC.  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 Discussed this CT and reviewed images with Dr. Elly Modena, on call for Gyn who recommends starting Lovenox 1mg /kg BID  and she will arrange close outpatient follow up in [facility] [place] [facility].  I discussed this plan with the patient and she is in agreement. Declines pain medications. Recommend she RTED or MAU if pain is uncontrolled or she has any other concerns.   [CS]    Clinical Course User Index [CS] Truddie Hidden, MD     MDM Rules/Calculators/A&P Medical Decision Making Problems Addressed: Deep venous thrombosis of pelvic vein: acute illness or injury Pelvic varices: acute illness or injury  Amount and/or Complexity of Data Reviewed Labs: ordered. Decision-making details documented in ED Course. Radiology: ordered and independent interpretation performed. Decision-making details documented in ED Course.  Risk Prescription drug management. Decision regarding hospitalization.    Final Clinical Impression(s) / ED Diagnoses Final diagnoses:  Deep venous thrombosis of pelvic vein  Pelvic varices    Rx / DC Orders ED Discharge Orders          Ordered    enoxaparin (LOVENOX) 40 MG/0.4ML injection  Every 12 hours        06/10/21 0153             Truddie Hidden, MD 06/10/21 [phone]

## 2021-06-09 NOTE — ED Notes (Signed)
Patient aware of need for urine sample ?

## 2021-06-10 ENCOUNTER — Emergency Department (HOSPITAL_BASED_OUTPATIENT_CLINIC_OR_DEPARTMENT_OTHER): Payer: Medicaid Other

## 2021-06-10 ENCOUNTER — Other Ambulatory Visit: Payer: Self-pay | Admitting: Obstetrics and Gynecology

## 2021-06-10 DIAGNOSIS — I8290 Acute embolism and thrombosis of unspecified vein: Secondary | ICD-10-CM

## 2021-06-10 LAB — CBC WITH DIFFERENTIAL/PLATELET
Abs Immature Granulocytes: 0.02 10*3/uL (ref 0.00–0.07)
Basophils Absolute: 0 10*3/uL (ref 0.0–0.1)
Basophils Relative: 1 %
Eosinophils Absolute: 0.1 10*3/uL (ref 0.0–0.5)
Eosinophils Relative: 1 %
HCT: 31.3 % — ABNORMAL LOW (ref 36.0–46.0)
Hemoglobin: 9.7 g/dL — ABNORMAL LOW (ref 12.0–15.0)
Immature Granulocytes: 0 %
Lymphocytes Relative: 36 %
Lymphs Abs: 2.8 10*3/uL (ref 0.7–4.0)
MCH: 28.4 pg (ref 26.0–34.0)
MCHC: 31 g/dL (ref 30.0–36.0)
MCV: 91.8 fL (ref 80.0–100.0)
Monocytes Absolute: 0.6 10*3/uL (ref 0.1–1.0)
Monocytes Relative: 8 %
Neutro Abs: 4.3 10*3/uL (ref 1.7–7.7)
Neutrophils Relative %: 54 %
Platelets: 361 10*3/uL (ref 150–400)
RBC: 3.41 MIL/uL — ABNORMAL LOW (ref 3.87–5.11)
RDW: 15.9 % — ABNORMAL HIGH (ref 11.5–15.5)
WBC: 7.7 10*3/uL (ref 4.0–10.5)
nRBC: 0 % (ref 0.0–0.2)

## 2021-06-10 LAB — COMPREHENSIVE METABOLIC PANEL
ALT: 11 U/L (ref 0–44)
AST: 14 U/L — ABNORMAL LOW (ref 15–41)
Albumin: 4 g/dL (ref 3.5–5.0)
Alkaline Phosphatase: 40 U/L (ref 38–126)
Anion gap: 7 (ref 5–15)
BUN: 10 mg/dL (ref 6–20)
CO2: 26 mmol/L (ref 22–32)
Calcium: 8.9 mg/dL (ref 8.9–10.3)
Chloride: 104 mmol/L (ref 98–111)
Creatinine, Ser: 0.7 mg/dL (ref 0.44–1.00)
GFR, Estimated: >60 mL/min (ref >60)
Glucose, Bld: 88 mg/dL (ref 70–99)
Potassium: 3.9 mmol/L (ref 3.5–5.1)
Sodium: 137 mmol/L (ref 135–145)
Total Bilirubin: 0.3 mg/dL (ref 0.3–1.2)
Total Protein: 7 g/dL (ref 6.5–8.1)

## 2021-06-10 LAB — URINALYSIS, ROUTINE W REFLEX MICROSCOPIC
Bilirubin Urine: NEGATIVE
Glucose, UA: NEGATIVE mg/dL
Hgb urine dipstick: NEGATIVE
Ketones, ur: NEGATIVE mg/dL
Leukocytes,Ua: NEGATIVE
Nitrite: NEGATIVE
Specific Gravity, Urine: 1.022 (ref 1.005–1.030)
pH: 7.5 (ref 5.0–8.0)

## 2021-06-10 LAB — LIPASE, BLOOD: Lipase: 36 U/L (ref 11–51)

## 2021-06-10 MED ORDER — ENOXAPARIN SODIUM 80 MG/0.8ML IJ SOSY
1.0000 mg/kg | PREFILLED_SYRINGE | Freq: Once | INTRAMUSCULAR | Status: AC
  Administered 2021-06-10: 75 mg via SUBCUTANEOUS
  Filled 2021-06-10: qty 0.8

## 2021-06-10 MED ORDER — ENOXAPARIN SODIUM 40 MG/0.4ML IJ SOSY: 75.0000 mg | PREFILLED_SYRINGE | Freq: Two times a day (BID) | INTRAMUSCULAR | 0 refills | Status: DC

## 2021-06-10 MED ORDER — IOHEXOL 300 MG/ML  SOLN
100.0000 mL | Freq: Once | INTRAMUSCULAR | Status: AC | PRN
  Administered 2021-06-10: 100 mL via INTRAVENOUS

## 2021-06-10 NOTE — ED Notes (Signed)
Lovenox teaching reviewed with pt.  Pt verbalizes understanding

## 2021-06-10 NOTE — Discharge Instructions (Addendum)
Your CT scan shows a dilated gonadal vein with varicose veins in the pelvis and small clots within the veins. We are starting you on a blood thinner to prevent these clots from getting larger. Please follow up with the [facility] early next week

## 2021-06-10 NOTE — ED Notes (Signed)
Patient transported to CT 

## 2021-06-17 ENCOUNTER — Other Ambulatory Visit: Payer: Self-pay | Admitting: Family

## 2021-06-17 DIAGNOSIS — D6859 Other primary thrombophilia: Secondary | ICD-10-CM

## 2021-06-17 DIAGNOSIS — I8290 Acute embolism and thrombosis of unspecified vein: Secondary | ICD-10-CM

## 2021-06-21 ENCOUNTER — Inpatient Hospital Stay: Payer: Medicaid Other | Attending: Hematology & Oncology

## 2021-06-21 ENCOUNTER — Encounter: Payer: Self-pay | Admitting: Family

## 2021-06-21 ENCOUNTER — Other Ambulatory Visit: Payer: Self-pay

## 2021-06-21 ENCOUNTER — Inpatient Hospital Stay (HOSPITAL_BASED_OUTPATIENT_CLINIC_OR_DEPARTMENT_OTHER): Payer: Medicaid Other | Admitting: Family

## 2021-06-21 VITALS — BP 110/63 | HR 70 | Temp 98.5°F | Resp 18 | Ht 61.0 in | Wt 170.1 lb

## 2021-06-21 DIAGNOSIS — I868 Varicose veins of other specified sites: Secondary | ICD-10-CM | POA: Diagnosis not present

## 2021-06-21 DIAGNOSIS — I8290 Acute embolism and thrombosis of unspecified vein: Secondary | ICD-10-CM

## 2021-06-21 DIAGNOSIS — D6859 Other primary thrombophilia: Secondary | ICD-10-CM

## 2021-06-21 LAB — CBC WITH DIFFERENTIAL (CANCER CENTER ONLY)
Abs Immature Granulocytes: 0.02 10*3/uL (ref 0.00–0.07)
Basophils Absolute: 0 10*3/uL (ref 0.0–0.1)
Basophils Relative: 0 %
Eosinophils Absolute: 0.1 10*3/uL (ref 0.0–0.5)
Eosinophils Relative: 1 %
HCT: 32.6 % — ABNORMAL LOW (ref 36.0–46.0)
Hemoglobin: 10.2 g/dL — ABNORMAL LOW (ref 12.0–15.0)
Immature Granulocytes: 0 %
Lymphocytes Relative: 25 %
Lymphs Abs: 1.8 10*3/uL (ref 0.7–4.0)
MCH: 28.7 pg (ref 26.0–34.0)
MCHC: 31.3 g/dL (ref 30.0–36.0)
MCV: 91.8 fL (ref 80.0–100.0)
Monocytes Absolute: 0.5 10*3/uL (ref 0.1–1.0)
Monocytes Relative: 6 %
Neutro Abs: 4.9 10*3/uL (ref 1.7–7.7)
Neutrophils Relative %: 68 %
Platelet Count: 358 10*3/uL (ref 150–400)
RBC: 3.55 MIL/uL — ABNORMAL LOW (ref 3.87–5.11)
RDW: 15.7 % — ABNORMAL HIGH (ref 11.5–15.5)
WBC Count: 7.3 10*3/uL (ref 4.0–10.5)
nRBC: 0 % (ref 0.0–0.2)

## 2021-06-21 LAB — CMP (CANCER CENTER ONLY)
ALT: 13 U/L (ref 0–44)
AST: 16 U/L (ref 15–41)
Albumin: 4.3 g/dL (ref 3.5–5.0)
Alkaline Phosphatase: 46 U/L (ref 38–126)
Anion gap: 8 (ref 5–15)
BUN: 12 mg/dL (ref 6–20)
CO2: 24 mmol/L (ref 22–32)
Calcium: 9.3 mg/dL (ref 8.9–10.3)
Chloride: 104 mmol/L (ref 98–111)
Creatinine: 0.68 mg/dL (ref 0.44–1.00)
GFR, Estimated: >60 mL/min (ref >60)
Glucose, Bld: 87 mg/dL (ref 70–99)
Potassium: 3.8 mmol/L (ref 3.5–5.1)
Sodium: 136 mmol/L (ref 135–145)
Total Bilirubin: 0.4 mg/dL (ref 0.3–1.2)
Total Protein: 7.7 g/dL (ref 6.5–8.1)

## 2021-06-21 LAB — D-DIMER, QUANTITATIVE: D-Dimer, Quant: 0.94 ug/mL-FEU — ABNORMAL HIGH (ref 0.00–0.50)

## 2021-06-21 LAB — ANTITHROMBIN III: AntiThromb III Func: 100 % (ref 75–120)

## 2021-06-21 MED ORDER — ENOXAPARIN SODIUM 120 MG/0.8ML IJ SOSY: 120.0000 mg | PREFILLED_SYRINGE | INTRAMUSCULAR | 1 refills | Status: DC

## 2021-06-21 NOTE — Progress Notes (Signed)
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  She had presented in the ED with persistent left abdomen pain for over a week. CT of abdomen and pelvis confirmed the above findings.  She was treated with Lovenox 40 mg SQ BID and finished her supply last Friday.  She has no prior history of thrombotic event and no known family history.  No abnormal blood loss, bruising or petechiae noted.  She states that her cycle is regular with heavy flow.  She denies travelling, injury or infection.  She has not been on birth control for "years".  She does vape.  She drinks a coffee in the mornings. No energy drinks.  She has 4 children, no history of miscarriage.  She has had her gall bladder removed and a C-section without any complications.  No history of diabetes or thyroid disease.  No personal history of cancer. She states that there is family history of cancer but she does not know what primaries.  Her mother has Lupus.  No fever, chills, n/v, cough, rash, dizziness, SOB, chest pain, palpitations, abdominal pain or changes in bowel or bladder habits.  No swelling, tenderness, numbness or tingling in her extremities.  No falls or syncope reported.  No recreational drug use. Occasional  alcoholic beverage socially.    ROS: All other 10 point review of systems is negative.   PAST MEDICAL HISTORY:   Past Medical History:  Diagnosis Date   Anemia     ALLERGIES: No Known Allergies    MEDICATIONS:  No current outpatient medications on file prior to visit.   No current facility-administered medications on file prior to visit.     PAST SURGICAL HISTORY Past Surgical History:  Procedure Laterality Date   CESAREAN SECTION     CHOLECYSTECTOMY N/A 06/02/2014   Procedure: LAPAROSCOPIC CHOLECYSTECTOMY WITH INTRAOPERATIVE CHOLANGIOGRAM;  Surgeon: Luretha Murphy, MD;  Location: [facility];  Service: General;  Laterality: N/A;   TUBAL LIGATION  10/05/2011   Procedure: POST PARTUM TUBAL LIGATION;  Surgeon: Tereso Newcomer, MD;  Location: [facility];  Service: Gynecology;  Laterality: Bilateral;    FAMILY HISTORY: Family History  Problem Relation Age of Onset   Hypertension Mother    Diabetes Maternal Grandfather     SOCIAL HISTORY:  reports that she quit smoking about 7 years ago. Her smoking use included cigarettes. She smoked an average of .5 packs per day. She has never used smokeless tobacco. She reports that she does not drink alcohol and does not use drugs.  PERFORMANCE STATUS: The patient's performance status is 1 - Symptomatic but completely ambulatory  PHYSICAL EXAM: Most Recent Vital Signs: Blood pressure 110/63, pulse 70, temperature 98.5 F (36.9 C), temperature source Oral, resp. rate 18, height 5\' 1"  (1.549 m), weight 170 lb 1.9 oz (77.2 kg), last menstrual period  06/02/2021, SpO2 100 %. BP 110/63 (BP Location: Left Arm, Patient Position: Sitting)   Pulse 70   Temp 98.5 F (36.9 C) (Oral)   Resp 18   Ht 5\' 1"  (1.549 m)   Wt 170 lb 1.9 oz (77.2 kg)   LMP 06/02/2021   SpO2 100%   BMI 32.14 kg/m   General Appearance:    Alert, cooperative, no distress, appears stated age  Head:    Normocephalic, without obvious abnormality, atraumatic  Eyes:    PERRL,  conjunctiva/corneas clear, EOM's intact, fundi    benign, both eyes        Throat:   Lips, mucosa, and tongue normal; teeth and gums normal  Neck:   Supple, symmetrical, trachea midline, no adenopathy;    thyroid:  no enlargement/tenderness/nodules; no carotid   bruit or JVD  Back:     Symmetric, no curvature, ROM normal, no CVA tenderness  Lungs:     Clear to auscultation bilaterally, respirations unlabored  Chest Wall:    No tenderness or deformity   Heart:    Regular rate and rhythm, S1 and S2 normal, no murmur, rub   or gallop     Abdomen:     Soft, non-tender, bowel sounds active all four quadrants,    no masses, no organomegaly        Extremities:   Extremities normal, atraumatic, no cyanosis or edema  Pulses:   2+ and symmetric all extremities  Skin:   Skin color, texture, turgor normal, no rashes or lesions  Lymph nodes:   Cervical, supraclavicular, and axillary nodes normal  Neurologic:   CNII-XII intact, normal strength, sensation and reflexes    throughout    LABORATORY DATA:  Results for orders placed or performed in visit on 06/21/21 (from the past 48 hour(s))  D-dimer, quantitative     Status: Abnormal   Collection Time: 06/21/21 10:27 AM  Result Value Ref Range   D-Dimer, Quant 0.94 (H) 0.00 - 0.50 ug/mL-FEU    Comment: (NOTE) At the manufacturer cut-off value of 0.5 g/mL FEU, this assay has a negative predictive value of 95-100%.This assay is intended for use in conjunction with a clinical pretest probability (PTP) assessment model to exclude pulmonary embolism (PE) and deep venous thrombosis (DVT) in outpatients suspected of PE or DVT. Results should be correlated with clinical presentation. Performed at [facility], [address] [place], [place]   CMP (Cancer Center only)     Status: None   Collection Time: 06/21/21 10:27 AM  Result Value Ref Range   Sodium 136 135 - 145 mmol/L   Potassium 3.8 3.5 - 5.1 mmol/L   Chloride 104 98  - 111 mmol/L   CO2 24 22 - 32 mmol/L   Glucose, Bld 87 70 - 99 mg/dL    Comment: Glucose reference range applies only to samples taken after fasting for at least 8 hours.   BUN 12 6 - 20 mg/dL   Creatinine 06/23/21 7.59 - 1.00 mg/dL   Calcium 9.3 8.9 - 1.63 mg/dL   Total Protein 7.7 6.5 - 8.1 g/dL   Albumin 4.3 3.5 - 5.0 g/dL   AST 16 15 - 41 U/L   ALT 13 0 - 44 U/L   Alkaline Phosphatase 46 38 - 126 U/L   Total Bilirubin 0.4 0.3 - 1.2 mg/dL   GFR, Estimated 84.6 >65 mL/min    Comment: (NOTE) Calculated using the CKD-EPI Creatinine Equation (2021)    Anion  gap 8 5 - 15    Comment: Performed at [facility] at [facility] [place], [address], [place], [place] [address]  CBC with Differential (Cancer Center Only)     Status: Abnormal   Collection Time: 06/21/21 10:27 AM  Result Value Ref Range   WBC Count 7.3 4.0 - 10.5 K/uL   RBC 3.55 (L) 3.87 - 5.11 MIL/uL   Hemoglobin 10.2 (L) 12.0 - 15.0 g/dL   HCT 60.432.6 (L) 54.036.0 - 98.146.0 %   MCV 91.8 80.0 - 100.0 fL   MCH 28.7 26.0 - 34.0 pg   MCHC 31.3 30.0 - 36.0 g/dL   RDW 19.115.7 (H) 47.811.5 - 29.515.5 %   Platelet Count 358 150 - 400 K/uL   nRBC 0.0 0.0 - 0.2 %   Neutrophils Relative % 68 %   Neutro Abs 4.9 1.7 - 7.7 K/uL   Lymphocytes Relative 25 %   Lymphs Abs 1.8 0.7 - 4.0 K/uL   Monocytes Relative 6 %   Monocytes Absolute 0.5 0.1 - 1.0 K/uL   Eosinophils Relative 1 %   Eosinophils Absolute 0.1 0.0 - 0.5 K/uL   Basophils Relative 0 %   Basophils Absolute 0.0 0.0 - 0.1 K/uL   Immature Granulocytes 0 %   Abs Immature Granulocytes 0.02 0.00 - 0.07 K/uL    Comment: Performed at [facility] at [facility] [place], [address], [place], [place] [address]      RADIOGRAPHY: No results found.     PATHOLOGY: None  ASSESSMENT/PLAN: Ms. 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   We will get her back on Lovenox 120 mg SQ once a day.  Referral placed with vascular surgery to assess and treat for nutcracker syndrome.  Hyper coag panel is pending.  Repeat CT of abdomen and pelvis and follow-up in 10 weeks.   All questions were answered. The patient knows to call the clinic with any problems, questions or concerns. We can certainly see the patient much sooner if necessary.  The patient was discussed with Dr. Myna HidalgoEnnever and he is in agreement with the aforementioned.   Eileen StanfordSarah Carter, NP

## 2021-06-22 LAB — CARDIOLIPIN ANTIBODIES, IGG, IGM, IGA
Anticardiolipin IgA: <9 APL U/mL (ref 0–11)
Anticardiolipin IgG: <9 GPL U/mL (ref 0–14)
Anticardiolipin IgM: <9 MPL U/mL (ref 0–12)

## 2021-06-22 LAB — HOMOCYSTEINE: Homocysteine: 21 umol/L — ABNORMAL HIGH (ref 0.0–14.5)

## 2021-06-23 ENCOUNTER — Ambulatory Visit: Payer: Medicaid Other | Admitting: Family Medicine

## 2021-06-23 LAB — LUPUS ANTICOAGULANT PANEL
DRVVT: 42.9 s (ref 0.0–47.0)
PTT Lupus Anticoagulant: 37.1 s (ref 0.0–43.5)

## 2021-06-23 LAB — BETA-2-GLYCOPROTEIN I ABS, IGG/M/A
Beta-2 Glyco I IgG: <9 GPI IgG units (ref 0–20)
Beta-2-Glycoprotein I IgA: <9 GPI IgA units (ref 0–25)
Beta-2-Glycoprotein I IgM: <9 GPI IgM units (ref 0–32)

## 2021-06-23 LAB — PROTEIN S, TOTAL: Protein S Ag, Total: 108 % (ref 60–150)

## 2021-06-23 LAB — PROTEIN C, TOTAL: Protein C, Total: 100 % (ref 60–150)

## 2021-06-23 LAB — PROTEIN C ACTIVITY: Protein C Activity: 122 % (ref 73–180)

## 2021-06-23 LAB — PROTEIN S ACTIVITY: Protein S Activity: 115 % (ref 63–140)

## 2021-06-24 ENCOUNTER — Telehealth: Payer: Self-pay | Admitting: *Deleted

## 2021-06-24 NOTE — Telephone Encounter (Signed)
Per 06/21/21 los - called and gave upcoming appointments - confirmed 

## 2021-06-27 LAB — FACTOR 5 LEIDEN

## 2021-06-27 LAB — PROTHROMBIN GENE MUTATION

## 2021-06-28 ENCOUNTER — Telehealth: Payer: Self-pay | Admitting: *Deleted

## 2021-06-28 ENCOUNTER — Other Ambulatory Visit: Payer: Self-pay | Admitting: Family

## 2021-06-28 DIAGNOSIS — D6859 Other primary thrombophilia: Secondary | ICD-10-CM

## 2021-06-28 DIAGNOSIS — E7211 Homocystinuria: Secondary | ICD-10-CM

## 2021-06-28 DIAGNOSIS — I8290 Acute embolism and thrombosis of unspecified vein: Secondary | ICD-10-CM

## 2021-06-28 MED ORDER — FOLIC ACID 1 MG PO TABS: 1.0000 mg | ORAL_TABLET | Freq: Every day | ORAL | 11 refills | Status: DC

## 2021-06-28 MED ORDER — FOLIC ACID 1 MG PO TABS: 2.0000 mg | ORAL_TABLET | Freq: Every day | ORAL | 11 refills | Status: DC

## 2021-06-28 NOTE — Telephone Encounter (Signed)
As noted below by Vicente Masson, I informed the patient that she had a high homocystine level which can cause ou to be higher risk for developing a blood clot. The treatment for this is Folic Acid. You will start taking 2 mg of Folic Acid and this will be long term. Please continue the Lovenox for now. No other issues found on your hyper coag panel. We will re-evaluate at your follow up with repeat CT of the abdomen and pelvis. I reviewed her schedule with her. She verbalized understanding.

## 2021-06-28 NOTE — Telephone Encounter (Signed)
-----   Message from Celso Amy, NP sent at 06/28/2021  1:11 PM EDT ----- Alicia Branch had a high homocystine level which can cause her to be higher risk for developing a blood clot. The treatment for this is folic acid! We will get her started on 2 mg of FA daily. Long term. She will continue her Lovenox for now. No other issues found on her hyper coag panel. We will re-evaluate at her follow-up with repeat CT of the abdomen and pelvis. Thank you!!!  Sarah  ----- Message ----- From: Buel Ream, Lab In [place] Sent: 06/21/2021  10:59 AM EDT To: Celso Amy, NP

## 2021-07-14 ENCOUNTER — Encounter: Payer: Self-pay | Admitting: Vascular Surgery

## 2021-07-14 ENCOUNTER — Other Ambulatory Visit: Payer: Self-pay | Admitting: Vascular Surgery

## 2021-07-14 ENCOUNTER — Ambulatory Visit (INDEPENDENT_AMBULATORY_CARE_PROVIDER_SITE_OTHER): Payer: Medicaid Other | Admitting: Vascular Surgery

## 2021-07-14 VITALS — BP 126/70 | HR 60 | Temp 98.2°F | Resp 20 | Ht 61.0 in | Wt 170.0 lb

## 2021-07-14 DIAGNOSIS — I871 Compression of vein: Secondary | ICD-10-CM | POA: Diagnosis not present

## 2021-07-14 DIAGNOSIS — N9489 Other specified conditions associated with female genital organs and menstrual cycle: Secondary | ICD-10-CM

## 2021-08-02 ENCOUNTER — Other Ambulatory Visit: Payer: Medicaid Other

## 2021-08-16 ENCOUNTER — Encounter: Payer: Self-pay | Admitting: *Deleted

## 2021-08-16 ENCOUNTER — Ambulatory Visit
Admission: RE | Admit: 2021-08-16 | Discharge: 2021-08-16 | Disposition: A | Discharge status: Home / Self Care | Payer: Medicaid Other | Source: Ambulatory Visit | Attending: Vascular Surgery | Admitting: Vascular Surgery

## 2021-08-16 DIAGNOSIS — N9489 Other specified conditions associated with female genital organs and menstrual cycle: Secondary | ICD-10-CM

## 2021-08-16 HISTORY — PX: IR RADIOLOGIST EVAL & MGMT: IMG5224

## 2021-08-16 NOTE — Consult Note (Signed)
Chief Complaint: Patient was seen in consultation today for left flank pain, possible nutcracker syndrome at the request of Dickson,Christopher S  Referring Physician(s): Dickson,Christopher S  History of Present Illness: Alicia Branch is a 31 y.o. female who was in her usual state of good health until May of this year when she developed sudden onset of left flank pain.  The pain lasted for approximately 2 weeks before it intensified to the point that she sought medical treatment at the emergency department.  CT imaging of the abdomen and pelvis demonstrated a dilated left gonadal vein with prominent venous collaterals in the anatomic pelvis concerning for ovarian vein reflux and pelvic venous congestion.  There was nonocclusive thrombus within collateral varices in the left pelvis.  Additionally, there was some suggestion of compression of the left renal vein by the overlying SMA which can be seen in the setting of nutcracker syndrome.  She was started on low molecular weight heparin for the nonocclusive thrombus and referred to Dr. Edilia Bo of vascular surgery.  As her symptoms were improving on low molecular weight heparin, Dr. Durwin Nora felt that surgical correction would be relatively aggressive.  He subsequently referred her to me for further evaluation and discussion of the underlying physiology and possible pelvic congestion and nutcracker syndromes.  Currently, Alicia Branch is doing much better.  Her left flank pain symptoms have completely resolved.  She describes a vague, difficult to explain sensation in the left flank but states specifically that it is no longer painful.  She has been moderately compliant with her Lovenox although she does find injecting herself challenging.  She admits that she has not taken the Lovenox these past 2 days.  I questioned her specifically about the common symptoms of pelvic congestion syndrome including pelvic heaviness and aching which is worse at the  end of the day particularly after being on her feet.  I also asked her about left lower quadrant postcoital aching, l and lower extremity edema.  She denies experiencing any of these symptoms.  Her pain was very focal to the left flank and lasted only during that 2-week interval.  She has never experienced any left flank pain or hematuria in the past.  Past Medical History:  Diagnosis Date   Acute deep vein thrombosis (DVT) of left side of pelvic region    Anemia     Past Surgical History:  Procedure Laterality Date   CESAREAN SECTION     CHOLECYSTECTOMY N/A 06/02/2014   Procedure: LAPAROSCOPIC CHOLECYSTECTOMY WITH INTRAOPERATIVE CHOLANGIOGRAM;  Surgeon: Luretha Murphy, MD;  Location: [facility];  Service: General;  Laterality: N/A;   TUBAL LIGATION  10/05/2011   Procedure: POST PARTUM TUBAL LIGATION;  Surgeon: Tereso Newcomer, MD;  Location: [facility];  Service: Gynecology;  Laterality: Bilateral;    Allergies: Patient has no known allergies.  Medications: Prior to Admission medications   Medication Sig Start Date End Date Taking? Authorizing Provider  enoxaparin (LOVENOX) 120 MG/0.8ML injection Inject 0.8 mLs (120 mg total) into the skin daily. 06/21/21   Erenest Blank, NP  folic acid (FOLVITE) 1 MG tablet Take 2 tablets (2 mg total) by mouth daily. 06/28/21   Erenest Blank, NP     Family History  Problem Relation Age of Onset   Hypertension Mother    Diabetes Maternal Grandfather     Social History   Socioeconomic History   Marital status: Single    Spouse name: Not on file   Number of children:  Not on file   Years of education: Not on file   Highest education level: Not on file  Occupational History   Not on file  Tobacco Use   Smoking status: Former    Packs/day: 0.50    Types: Cigarettes    Quit date: 02/09/2014    Years since quitting: 7.5   Smokeless tobacco: Never  Vaping Use   Vaping Use: Former  Substance and Sexual Activity   Alcohol use: No    Comment:  socially   Drug use: No   Sexual activity: Yes    Birth control/protection: None    Comment: last sex one week ago   Other Topics Concern   Not on file  Social History Narrative   Not on file   Social Determinants of Health   Financial Resource Strain: Not on file  Food Insecurity: Not on file  Transportation Needs: Not on file  Physical Activity: Not on file  Stress: Not on file  Social Connections: Not on file    Review of Systems: A 12 point ROS discussed and pertinent positives are indicated in the HPI above.  All other systems are negative.  Review of Systems  Vital Signs: BP 101/63 (BP Location: Left Arm)   Pulse 65   SpO2 97%     Physical Exam Constitutional:      General: She is not in acute distress.    Appearance: Normal appearance.  HENT:     Head: Normocephalic and atraumatic.  Eyes:     General: No scleral icterus. Cardiovascular:     Rate and Rhythm: Normal rate.  Pulmonary:     Effort: Pulmonary effort is normal.  Abdominal:     General: Abdomen is flat. There is no distension.     Palpations: Abdomen is soft.  Skin:    General: Skin is warm and dry.  Neurological:     Mental Status: She is alert and oriented to person, place, and time.  Psychiatric:        Mood and Affect: Mood normal.        Behavior: Behavior normal.       Imaging: No results found.  Labs:  CBC: Recent Labs    06/10/21 0003 06/21/21 1027  WBC 7.7 7.3  HGB 9.7* 10.2*  HCT 31.3* 32.6*  PLT 361 358    COAGS: No results for input(s): "INR", "APTT" in the last 8760 hours.  BMP: Recent Labs    06/10/21 0003 06/21/21 1027  NA 137 136  K 3.9 3.8  CL 104 104  CO2 26 24  GLUCOSE 88 87  BUN 10 12  CALCIUM 8.9 9.3  CREATININE 0.70 0.68  GFRNONAA >60 >60    LIVER FUNCTION TESTS: Recent Labs    06/10/21 0003 06/21/21 1027  BILITOT 0.3 0.4  AST 14* 16  ALT 11 13  ALKPHOS 40 46  PROT 7.0 7.7  ALBUMIN 4.0 4.3    TUMOR MARKERS: No results for  input(s): "AFPTM", "CEA", "CA199", "CHROMGRNA" in the last 8760 hours.  Assessment and Plan:  Very pleasant 31 year old female with acute onset of left flank pain.  She was diagnosed with  nonocclusive thrombophlebitis within the left pelvic venous collaterals and started on low molecular weight heparin.  Since beginning anticoagulation, her symptoms have largely resolved.    She specifically denies all symptoms that would be suggestive of pelvic congestion syndrome.  Her symptoms were purely left flank pain.  Interestingly, she has had no symptoms like this  in the past.  While nutcracker syndrome is possible, I would expect her to have a more issues with chronic left flank pain as well as intermittent hematuria.  Neither of these have been present.  I suspect that her pain was referred pain from thrombophlebitis which now appears to be resolved.  I discussed with her the physiology of both left renal vein compression syndrome (nutcracker syndrome) as well as the ovarian venous reflux and pelvic congestion syndrome.  We discussed that she should remain on anticoagulation for 6 months.  After that time if she is asymptomatic we could see how she does coming off anticoagulation.  If she has recurrent symptoms, she would either require long-term anticoagulation therapy, or correction of the underlying issue.  I described to her the diagnosis of nutcracker syndrome that requires a venogram with both intravascular ultrasound evaluation of the left renal vein as well as pressure measurements on either side of the suspected stenosis.  If there is a hemodynamically significant pressure gradient across the renal vein this is a diagnostic of nutcracker physiology.  Similarly, venography is often diagnostic for left ovarian vein reflux when retrograde flow can be documented.  Left ovarian venous reflux is treated by embolization of the abnormal left ovarian vein.  Nutcracker syndrome is, unfortunately, still a  problem best corrected by surgery.  The left renal vein often has to be rerouted around the region of compression.  Attempts at endovascular management with stent placement have been performed and documented in the literature over the years but the endovascular technique is fraught with complications including stent migration, stent fracture and renal vein thrombosis.  Given that her symptoms were acute rather than chronic in nature, and the fact that they seem to have resolved with anticoagulation, we discussed 2 options.  First, we could consider simple observation.  This may not be an issue that bothers her again in the future.  Second, we could consider diagnostic venography to confirm both nutcracker and ovarian venous reflux physiology.  This would be a minimally invasive outpatient procedure that would provide diagnostic information.  We would only pursue treatment if she developed recurrent pain or issues with recurrent thrombophlebitis in the future.  She voiced her understanding of my explanation of the issue as well as the possible options.  At this time, she would like to continue with observation.  I will have my staff schedule a follow-up appointment with her in 6 months to see how she is doing.  She has my card, if she develops recurrent symptoms or issues in the interim she will call and asked to be seen sooner.  1.)  Follow-up appointment in 6 months.  Thank you for this interesting consult.  I greatly enjoyed meeting Alicia Branch and look forward to participating in their care.  A copy of this report was sent to the requesting provider on this date.  Electronically Signed: Sterling Big 08/16/2021, 12:08 PM   I spent a total of  40 Minutes  in face to face in clinical consultation, greater than 50% of which was counseling/coordinating care for left flank pain, possible nutcracker syndrome.

## 2021-08-19 ENCOUNTER — Telehealth: Payer: Self-pay | Admitting: *Deleted

## 2021-08-19 NOTE — Telephone Encounter (Signed)
Patient called and stated,"I need you to ask Sarah,NP, can I switch from Lovenox to a PO form of blood thinner?" I instructed her that Maralyn Sago was out of the office today and returning on Monday. I will give this message to her then. She verbalized understanding.

## 2021-08-30 ENCOUNTER — Inpatient Hospital Stay (HOSPITAL_BASED_OUTPATIENT_CLINIC_OR_DEPARTMENT_OTHER): Payer: Medicaid Other | Admitting: Family

## 2021-08-30 ENCOUNTER — Encounter: Payer: Self-pay | Admitting: Family

## 2021-08-30 ENCOUNTER — Ambulatory Visit (HOSPITAL_BASED_OUTPATIENT_CLINIC_OR_DEPARTMENT_OTHER)
Admission: RE | Admit: 2021-08-30 | Discharge: 2021-08-30 | Disposition: A | Discharge status: Home / Self Care | Payer: Medicaid Other | Source: Ambulatory Visit | Attending: Family | Admitting: Family

## 2021-08-30 ENCOUNTER — Inpatient Hospital Stay: Payer: Medicaid Other | Attending: Hematology & Oncology

## 2021-08-30 ENCOUNTER — Other Ambulatory Visit: Payer: Self-pay

## 2021-08-30 ENCOUNTER — Encounter (HOSPITAL_BASED_OUTPATIENT_CLINIC_OR_DEPARTMENT_OTHER): Payer: Self-pay

## 2021-08-30 VITALS — BP 102/50 | HR 66 | Temp 97.9°F | Resp 18 | Ht 61.0 in | Wt 174.0 lb

## 2021-08-30 DIAGNOSIS — I8290 Acute embolism and thrombosis of unspecified vein: Secondary | ICD-10-CM | POA: Insufficient documentation

## 2021-08-30 DIAGNOSIS — E7211 Homocystinuria: Secondary | ICD-10-CM | POA: Diagnosis not present

## 2021-08-30 DIAGNOSIS — D6859 Other primary thrombophilia: Secondary | ICD-10-CM

## 2021-08-30 DIAGNOSIS — R7983 Abnormal findings of blood amino-acid level: Secondary | ICD-10-CM | POA: Insufficient documentation

## 2021-08-30 DIAGNOSIS — Z7901 Long term (current) use of anticoagulants: Secondary | ICD-10-CM | POA: Diagnosis not present

## 2021-08-30 DIAGNOSIS — I871 Compression of vein: Secondary | ICD-10-CM | POA: Diagnosis not present

## 2021-08-30 DIAGNOSIS — I868 Varicose veins of other specified sites: Secondary | ICD-10-CM | POA: Diagnosis present

## 2021-08-30 LAB — CBC WITH DIFFERENTIAL (CANCER CENTER ONLY)
Abs Immature Granulocytes: 0.01 10*3/uL (ref 0.00–0.07)
Basophils Absolute: 0 10*3/uL (ref 0.0–0.1)
Basophils Relative: 1 %
Eosinophils Absolute: 0.1 10*3/uL (ref 0.0–0.5)
Eosinophils Relative: 1 %
HCT: 34.5 % — ABNORMAL LOW (ref 36.0–46.0)
Hemoglobin: 10.5 g/dL — ABNORMAL LOW (ref 12.0–15.0)
Immature Granulocytes: 0 %
Lymphocytes Relative: 29 %
Lymphs Abs: 1.8 10*3/uL (ref 0.7–4.0)
MCH: 28.2 pg (ref 26.0–34.0)
MCHC: 30.4 g/dL (ref 30.0–36.0)
MCV: 92.7 fL (ref 80.0–100.0)
Monocytes Absolute: 0.5 10*3/uL (ref 0.1–1.0)
Monocytes Relative: 8 %
Neutro Abs: 3.8 10*3/uL (ref 1.7–7.7)
Neutrophils Relative %: 61 %
Platelet Count: 376 10*3/uL (ref 150–400)
RBC: 3.72 MIL/uL — ABNORMAL LOW (ref 3.87–5.11)
RDW: 15.3 % (ref 11.5–15.5)
WBC Count: 6.1 10*3/uL (ref 4.0–10.5)
nRBC: 0 % (ref 0.0–0.2)

## 2021-08-30 LAB — CMP (CANCER CENTER ONLY)
ALT: 10 U/L (ref 0–44)
AST: 13 U/L — ABNORMAL LOW (ref 15–41)
Albumin: 4.3 g/dL (ref 3.5–5.0)
Alkaline Phosphatase: 53 U/L (ref 38–126)
Anion gap: 7 (ref 5–15)
BUN: 9 mg/dL (ref 6–20)
CO2: 26 mmol/L (ref 22–32)
Calcium: 9.4 mg/dL (ref 8.9–10.3)
Chloride: 105 mmol/L (ref 98–111)
Creatinine: 0.7 mg/dL (ref 0.44–1.00)
GFR, Estimated: >60 mL/min (ref >60)
Glucose, Bld: 90 mg/dL (ref 70–99)
Potassium: 4 mmol/L (ref 3.5–5.1)
Sodium: 138 mmol/L (ref 135–145)
Total Bilirubin: 0.3 mg/dL (ref 0.3–1.2)
Total Protein: 7 g/dL (ref 6.5–8.1)

## 2021-08-30 LAB — D-DIMER, QUANTITATIVE: D-Dimer, Quant: 0.27 ug/mL-FEU (ref 0.00–0.50)

## 2021-08-30 MED ORDER — RIVAROXABAN 20 MG PO TABS: 20.0000 mg | ORAL_TABLET | Freq: Every day | ORAL | 6 refills | Status: DC

## 2021-08-30 MED ORDER — IOHEXOL 300 MG/ML  SOLN
100.0000 mL | Freq: Once | INTRAMUSCULAR | Status: AC | PRN
  Administered 2021-08-30: 100 mL via INTRAVENOUS

## 2021-08-30 NOTE — Progress Notes (Signed)
Hematology and Oncology Follow Up Visit  Alicia Branch [id number] [birth year] 31 y.o. 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 She is doing well and has no complaints at this time. She states that she would like to change from Lovenox to a daily pill. We will change her over to Xarelto.  Repeat CT of the abdomen and pelvis this morning showed "incomplete recanalization of previously demonstrated nonocclusive thrombus within left pelvic venous collaterals, no acute or new thrombus noted and stable asymmetric dilatation of the left ovarian vein to which mild compression of the left renal vein between the aorta and SMA may contribute."  She was evaluated by vascular surgery Dr. Edilia Bo and is felt to likely have nutcracker syndrome. He would prefer to avoid invasive surgery and has referred her to Dr. Vella Redhead for a second opinion and possible left renal stent placement.  Patient states that she was seen by Dr. Archer Asa and that he felt as long as she remained asymptomatic they could hold off on doing the left renal stent.  She has had no pain.  Her cycle is regular with normal flow. No other blood loss noted. No bruising or petehciae.  No fever, chills, n/v, cough, rash, dizziness, SOB, chest pain, palpitations, abdominal pain or changes in bowel or bladder habits.  No swelling, tenderness, numbness or tingling in her extremities.  No falls or syncope.  Appetite and hydration have been good. Weight is stable at 174 lbs.   ECOG Performance Status: 1 - Symptomatic but completely ambulatory  Medications:  Allergies as of 08/30/2021   No Known Allergies      Medication List        Accurate as of August 30, 2021 10:03 AM. If you  have any questions, ask your nurse or doctor.          enoxaparin 120 MG/0.8ML injection Commonly known as: LOVENOX Inject 0.8 mLs (120 mg total) into the skin daily.   folic acid 1 MG tablet Commonly known as: FOLVITE Take 2 tablets (2 mg total) by mouth daily.        Allergies: No Known Allergies  Past Medical History, Surgical history, Social history, and Family History were reviewed and updated.  Review of Systems: All other 10 point review of systems is negative.   Physical Exam:  vitals were not taken for this visit.   Wt Readings from Last 3 Encounters:  07/14/21 170 lb (77.1 kg)  06/21/21 170 lb 1.9 oz (77.2 kg)  06/09/21 163 lb (73.9 kg)    Ocular: Sclerae unicteric, pupils equal, round and reactive to light Ear-nose-throat: Oropharynx clear, dentition fair Lymphatic: No cervical or supraclavicular adenopathy Lungs no rales or rhonchi, good excursion bilaterally Heart regular rate and rhythm, no murmur appreciated Abd soft, nontender, positive bowel sounds MSK no focal spinal tenderness, no joint edema Neuro: non-focal, well-oriented, appropriate affect Breasts: Deferred   Lab Results  Component Value Date   WBC 6.1 08/30/2021   HGB 10.5 (L) 08/30/2021   HCT 34.5 (L) 08/30/2021   MCV 92.7 08/30/2021   PLT 376 08/30/2021   No results found for: "FERRITIN", "IRON", "TIBC", "UIBC", "IRONPCTSAT" Lab Results  Component Value Date   RBC 3.72 (L) 08/30/2021   No results found  for: "KPAFRELGTCHN", "LAMBDASER", "KAPLAMBRATIO" No results found for: "IGGSERUM", "IGA", "IGMSERUM" No results found for: "TOTALPROTELP", "ALBUMINELP", "A1GS", "A2GS", "BETS", "BETA2SER", "GAMS", "MSPIKE", "SPEI"   Chemistry      Component Value Date/Time   NA 138 08/30/2021 0859   K 4.0 08/30/2021 0859   CL 105 08/30/2021 0859   CO2 26 08/30/2021 0859   BUN 9 08/30/2021 0859   CREATININE 0.70 08/30/2021 0859      Component Value Date/Time   CALCIUM 9.4 08/30/2021 0859    ALKPHOS 53 08/30/2021 0859   AST 13 (L) 08/30/2021 0859   ALT 10 08/30/2021 0859   BILITOT 0.3 08/30/2021 0859       Impression and Plan: Alicia Branch is a very pleasant 31 yo African American female with recent diagnosis of 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 She was also found to have an elevated homocystine level.  Lovenox discontinued and new prescription for Xarelto 20 mg Po daily sent [facility].   Continue daily folic acid.  Follow-up in 3 months.   Eileen Stanford, NP 8/8/202310:03 AM

## 2021-08-31 ENCOUNTER — Telehealth: Payer: Self-pay | Admitting: *Deleted

## 2021-08-31 NOTE — Telephone Encounter (Signed)
Per 08/31/21 los - called and gave upcoming appointments - confirmed 

## 2021-09-02 LAB — HOMOCYSTEINE: Homocysteine: 9.2 umol/L (ref 0.0–14.5)

## 2021-11-30 ENCOUNTER — Ambulatory Visit: Payer: Medicaid Other | Admitting: Family

## 2021-11-30 ENCOUNTER — Other Ambulatory Visit: Payer: Medicaid Other

## 2021-12-06 ENCOUNTER — Inpatient Hospital Stay: Payer: Medicaid Other | Admitting: Family

## 2021-12-06 ENCOUNTER — Inpatient Hospital Stay: Payer: Medicaid Other

## 2021-12-11 ENCOUNTER — Other Ambulatory Visit: Payer: Self-pay | Admitting: Family

## 2021-12-11 DIAGNOSIS — D6859 Other primary thrombophilia: Secondary | ICD-10-CM

## 2021-12-11 DIAGNOSIS — I8290 Acute embolism and thrombosis of unspecified vein: Secondary | ICD-10-CM

## 2021-12-14 ENCOUNTER — Inpatient Hospital Stay: Payer: Medicaid Other

## 2021-12-14 ENCOUNTER — Inpatient Hospital Stay (HOSPITAL_BASED_OUTPATIENT_CLINIC_OR_DEPARTMENT_OTHER): Payer: Medicaid Other | Admitting: Family

## 2021-12-14 ENCOUNTER — Inpatient Hospital Stay: Payer: Medicaid Other | Attending: Hematology & Oncology

## 2021-12-14 ENCOUNTER — Encounter: Payer: Self-pay | Admitting: Family

## 2021-12-14 VITALS — BP 101/74 | HR 89 | Temp 97.9°F | Resp 18 | Wt 159.0 lb

## 2021-12-14 DIAGNOSIS — I8290 Acute embolism and thrombosis of unspecified vein: Secondary | ICD-10-CM

## 2021-12-14 DIAGNOSIS — I871 Compression of vein: Secondary | ICD-10-CM | POA: Insufficient documentation

## 2021-12-14 DIAGNOSIS — D6859 Other primary thrombophilia: Secondary | ICD-10-CM

## 2021-12-14 DIAGNOSIS — Z7901 Long term (current) use of anticoagulants: Secondary | ICD-10-CM | POA: Insufficient documentation

## 2021-12-14 DIAGNOSIS — E7211 Homocystinuria: Secondary | ICD-10-CM

## 2021-12-14 DIAGNOSIS — D509 Iron deficiency anemia, unspecified: Secondary | ICD-10-CM

## 2021-12-14 DIAGNOSIS — K5900 Constipation, unspecified: Secondary | ICD-10-CM | POA: Insufficient documentation

## 2021-12-14 DIAGNOSIS — I868 Varicose veins of other specified sites: Secondary | ICD-10-CM | POA: Diagnosis present

## 2021-12-14 DIAGNOSIS — R7983 Abnormal findings of blood amino-acid level: Secondary | ICD-10-CM | POA: Diagnosis not present

## 2021-12-14 DIAGNOSIS — I82890 Acute embolism and thrombosis of other specified veins: Secondary | ICD-10-CM | POA: Diagnosis not present

## 2021-12-14 LAB — CBC WITH DIFFERENTIAL (CANCER CENTER ONLY)
Abs Immature Granulocytes: 0.01 10*3/uL (ref 0.00–0.07)
Basophils Absolute: 0 10*3/uL (ref 0.0–0.1)
Basophils Relative: 1 %
Eosinophils Absolute: 0 10*3/uL (ref 0.0–0.5)
Eosinophils Relative: 1 %
HCT: 34.6 % — ABNORMAL LOW (ref 36.0–46.0)
Hemoglobin: 11 g/dL — ABNORMAL LOW (ref 12.0–15.0)
Immature Granulocytes: 0 %
Lymphocytes Relative: 29 %
Lymphs Abs: 2.3 10*3/uL (ref 0.7–4.0)
MCH: 29.5 pg (ref 26.0–34.0)
MCHC: 31.8 g/dL (ref 30.0–36.0)
MCV: 92.8 fL (ref 80.0–100.0)
Monocytes Absolute: 0.6 10*3/uL (ref 0.1–1.0)
Monocytes Relative: 8 %
Neutro Abs: 5.1 10*3/uL (ref 1.7–7.7)
Neutrophils Relative %: 61 %
Platelet Count: 385 10*3/uL (ref 150–400)
RBC: 3.73 MIL/uL — ABNORMAL LOW (ref 3.87–5.11)
RDW: 14.3 % (ref 11.5–15.5)
WBC Count: 8.1 10*3/uL (ref 4.0–10.5)
nRBC: 0 % (ref 0.0–0.2)

## 2021-12-14 LAB — D-DIMER, QUANTITATIVE: D-Dimer, Quant: <0.27 ug/mL-FEU (ref 0.00–0.50)

## 2021-12-14 LAB — CMP (CANCER CENTER ONLY)
ALT: 9 U/L (ref 0–44)
AST: 14 U/L — ABNORMAL LOW (ref 15–41)
Albumin: 4.5 g/dL (ref 3.5–5.0)
Alkaline Phosphatase: 57 U/L (ref 38–126)
Anion gap: 8 (ref 5–15)
BUN: 7 mg/dL (ref 6–20)
CO2: 26 mmol/L (ref 22–32)
Calcium: 9.8 mg/dL (ref 8.9–10.3)
Chloride: 104 mmol/L (ref 98–111)
Creatinine: 0.68 mg/dL (ref 0.44–1.00)
GFR, Estimated: >60 mL/min (ref >60)
Glucose, Bld: 93 mg/dL (ref 70–99)
Potassium: 4.1 mmol/L (ref 3.5–5.1)
Sodium: 138 mmol/L (ref 135–145)
Total Bilirubin: 0.4 mg/dL (ref 0.3–1.2)
Total Protein: 7.5 g/dL (ref 6.5–8.1)

## 2021-12-14 LAB — FERRITIN: Ferritin: 5 ng/mL — ABNORMAL LOW (ref 11–307)

## 2021-12-14 MED ORDER — LACTULOSE 10 GM/15ML PO SOLN: 10.0000 g | Freq: Three times a day (TID) | ORAL | 0 refills | Status: AC | PRN

## 2021-12-14 MED ORDER — RIVAROXABAN 20 MG PO TABS: 20.0000 mg | ORAL_TABLET | Freq: Every day | ORAL | 6 refills | Status: DC

## 2021-12-14 NOTE — Progress Notes (Signed)
Hematology and Oncology Follow Up Visit  Alicia Branch [id number] [birth year] 31 y.o. 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 She is doing fairly well. She notes persistent constipation over the last 3 weeks. She has some gas and bowel sounds are present throughout. She notes left lower abdominal pain with this. She states that she has tried multiple OTC remedies as well as prune juice without any relief.  She has occasional nausea and headaches.  No fever, chills, vomiting, cough, rash, dizziness, SOB, chest pain, palpitations or changes in bladder habits.  No blood loss noted. No bruising or petechiae.  No swelling, tenderness, numbness or tingling in her extremities.  No falls or syncope.  She states that her appetite, hydration and activity level are good. She is noted to have lost 15 lbs since her last visit in August. She is 159 lbs, previously 174 lbs.   ECOG Performance Status: 1 - Symptomatic but completely ambulatory  Medications:  Allergies as of 12/14/2021   No Known Allergies      Medication List        Accurate as of December 14, 2021  1:47 PM. If you have any questions, ask your nurse or doctor.          folic acid 1 MG tablet Commonly known as: FOLVITE Take 2 tablets (2 mg total) by mouth daily.   rivaroxaban 20 MG Tabs tablet Commonly known as: XARELTO Take 1 tablet (20 mg total) by mouth daily with supper.        Allergies: No Known Allergies  Past Medical History, Surgical history, Social history, and Family History were reviewed and updated.  Review of Systems: All other 10 point review of systems is negative.   Physical Exam:  vitals  were not taken for this visit.   Wt Readings from Last 3 Encounters:  08/30/21 174 lb (78.9 kg)  07/14/21 170 lb (77.1 kg)  06/21/21 170 lb 1.9 oz (77.2 kg)    Ocular: Sclerae unicteric, pupils equal, round and reactive to light Ear-nose-throat: Oropharynx clear, dentition fair Lymphatic: No cervical or supraclavicular adenopathy Lungs no rales or rhonchi, good excursion bilaterally Heart regular rate and rhythm, no murmur appreciated Abd soft, nontender, positive bowel sounds MSK no focal spinal tenderness, no joint edema Neuro: non-focal, well-oriented, appropriate affect Breasts: Deferred   Lab Results  Component Value Date   WBC 6.1 08/30/2021   HGB 10.5 (L) 08/30/2021   HCT 34.5 (L) 08/30/2021   MCV 92.7 08/30/2021   PLT 376 08/30/2021   No results found for: "FERRITIN", "IRON", "TIBC", "UIBC", "IRONPCTSAT" Lab Results  Component Value Date   RBC 3.72 (L) 08/30/2021   No results found for: "KPAFRELGTCHN", "LAMBDASER", "KAPLAMBRATIO" No results found for: "IGGSERUM", "IGA", "IGMSERUM" No results found for: "TOTALPROTELP", "ALBUMINELP", "A1GS", "A2GS", "BETS", "BETA2SER", "GAMS", "MSPIKE", "SPEI"   Chemistry      Component Value Date/Time   NA 138 08/30/2021 0859   K 4.0 08/30/2021 0859   CL 105 08/30/2021 0859   CO2 26 08/30/2021 0859   BUN 9 08/30/2021 0859   CREATININE 0.70 08/30/2021 0859      Component Value Date/Time   CALCIUM 9.4  08/30/2021 0859   ALKPHOS 53 08/30/2021 0859   AST 13 (L) 08/30/2021 0859   ALT 10 08/30/2021 0859   BILITOT 0.3 08/30/2021 0859       Impression and Plan: Alicia Branch is a very pleasant 31 yo African American female with recent diagnosis of 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 She was also found to have an elevated homocystine level.  D-dimer stable at < 0.27.  She will continue her same regimen with Xarelto and daily folic acid.  Xarelto refilled.   Lactulose also sent in for constipation.  Iron studies are pending.  Discussed patient with Dr. Myna Hidalgo and we will repeat a CT of the abdomen and pelvis if left lower quadrant pain persists after constipation has resolved.  Follow-up in 3 months.   Eileen Stanford, NP 11/22/20231:47 PM

## 2021-12-16 LAB — HOMOCYSTEINE: Homocysteine: 9.4 umol/L (ref 0.0–14.5)

## 2021-12-19 ENCOUNTER — Telehealth: Payer: Self-pay

## 2021-12-19 LAB — IRON AND IRON BINDING CAPACITY (CC-WL,HP ONLY)
Iron: 85 ug/dL (ref 28–170)
Saturation Ratios: 19 % (ref 10.4–31.8)
TIBC: 456 ug/dL — ABNORMAL HIGH (ref 250–450)
UIBC: 371 ug/dL (ref 148–442)

## 2021-12-19 NOTE — Telephone Encounter (Signed)
Received message to call patient regarding a medication questions. Upon talking to patient she stated that she was started on lactulose three times daily last week with no relief. Pt stated she has not had a complete bowel movement in weeks but had a "pebble bowl movement" every few days. Pt stated she has noticed bright red blood in her stool starting last night. Pt denied any hemorrhoids. Pt stated she has been drinking lots of water but has had increasing pain in her abdomen. Pt advised to go to urgent care or ER at this time to have the bright red blood and abdominal pain evaluated since she has not had a complete bowel movement in weeks. Pt stated she has tried miralax and laxatives with no relief. Pt verbalized understanding of plan and had no further questions.

## 2021-12-20 ENCOUNTER — Other Ambulatory Visit: Payer: Self-pay | Admitting: Family

## 2021-12-20 DIAGNOSIS — D509 Iron deficiency anemia, unspecified: Secondary | ICD-10-CM

## 2021-12-22 ENCOUNTER — Telehealth: Payer: Self-pay | Admitting: *Deleted

## 2021-12-22 NOTE — Telephone Encounter (Signed)
Per scheduling message Maralyn Sago - Called and lvm for call back to schedule (1) dose of IV Iron

## 2021-12-29 ENCOUNTER — Telehealth: Payer: Self-pay | Admitting: *Deleted

## 2021-12-29 NOTE — Telephone Encounter (Signed)
Per scheduling message - Maralyn Sago - Patient said that she would call back today after looking at her work schedule. (1) dose of IV Iron

## 2022-01-03 ENCOUNTER — Inpatient Hospital Stay: Payer: Medicaid Other | Attending: Hematology & Oncology

## 2022-01-03 VITALS — BP 113/66 | HR 83 | Temp 98.3°F | Resp 17

## 2022-01-03 DIAGNOSIS — D509 Iron deficiency anemia, unspecified: Secondary | ICD-10-CM | POA: Diagnosis not present

## 2022-01-03 MED ORDER — SODIUM CHLORIDE 0.9 % IV SOLN
1000.0000 mg | Freq: Once | INTRAVENOUS | Status: AC
  Administered 2022-01-03: 1000 mg via INTRAVENOUS
  Filled 2022-01-03: qty 10

## 2022-01-03 MED ORDER — SODIUM CHLORIDE 0.9 % IV SOLN: Freq: Once | INTRAVENOUS | Status: AC

## 2022-01-03 NOTE — Patient Instructions (Signed)

## 2022-01-11 ENCOUNTER — Other Ambulatory Visit: Payer: Self-pay | Admitting: Interventional Radiology

## 2022-01-11 DIAGNOSIS — N9489 Other specified conditions associated with female genital organs and menstrual cycle: Secondary | ICD-10-CM

## 2022-02-14 ENCOUNTER — Ambulatory Visit
Admission: RE | Admit: 2022-02-14 | Discharge: 2022-02-14 | Disposition: A | Discharge status: Home / Self Care | Payer: Medicaid Other | Source: Ambulatory Visit | Attending: Interventional Radiology | Admitting: Interventional Radiology

## 2022-02-14 ENCOUNTER — Other Ambulatory Visit: Payer: Self-pay | Admitting: Interventional Radiology

## 2022-02-14 DIAGNOSIS — N9489 Other specified conditions associated with female genital organs and menstrual cycle: Secondary | ICD-10-CM

## 2022-02-14 NOTE — Progress Notes (Signed)
Chief Complaint: Patient was seen in consultation today for left internal iliac vein thrombus, possible ovarian vein reflux at the request of Latressa Harries K  Referring Physician(s): Dickson,Christopher S   History of Present Illness: Alicia Branch is a 32 y.o. female who was in her usual state of good health until May of this year when she developed sudden onset of left flank pain.  The pain lasted for approximately 2 weeks before it intensified to the point that she sought medical treatment at the emergency department.  CT imaging of the abdomen and pelvis demonstrated a dilated left gonadal vein with prominent venous collaterals in the anatomic pelvis concerning for ovarian vein reflux and pelvic venous congestion.  There was nonocclusive thrombus within collateral varices in the left pelvis.  Additionally, there was some suggestion of compression of the left renal vein by the overlying SMA which can be seen in the setting of nutcracker syndrome.  She was started on low molecular weight heparin for the nonocclusive thrombus and referred to Dr. Edilia Bo of vascular surgery.  As her symptoms were improving on low molecular weight heparin, Dr. Durwin Nora felt that surgical correction would be relatively aggressive.  He subsequently referred her to me for further evaluation and discussion of the underlying physiology and possible pelvic congestion and nutcracker syndromes.  I initially saw her in July 2023.  At that time, she had been on anticoagulation since March and her clinical symptoms had largely resolved.  She continued to have some "odd" feelings in her left flank and pelvis, but nothing particularly painful.  Additionally, she had no issues with hematuria.  We elected for observation and I saw her in follow-up today.  In the interim, she had a follow-up CT scan of the abdomen and pelvis on 08/30/2021 which demonstrated partial recanalization of thrombus in the left internal iliac venous  collaterals.  She continues on her anticoagulation.  She has had no recurrent severe symptoms.  She still reports occasional odd sensations in the left flank.  Today, her primary complaint is constipation.  She has been talking to her primary care provider about this.  Additionally, she expresses anxiety over wondering whether the blood clots were still there in her pelvis and if anything else is going on inside her body.  She is uncertain if or when she may come off of anticoagulation.  Past Medical History:  Diagnosis Date   Acute deep vein thrombosis (DVT) of left side of pelvic region    Anemia     Past Surgical History:  Procedure Laterality Date   CESAREAN SECTION     CHOLECYSTECTOMY N/A 06/02/2014   Procedure: LAPAROSCOPIC CHOLECYSTECTOMY WITH INTRAOPERATIVE CHOLANGIOGRAM;  Surgeon: Luretha Murphy, MD;  Location: [facility];  Service: General;  Laterality: N/A;   IR RADIOLOGIST EVAL & MGMT  08/16/2021   TUBAL LIGATION  10/05/2011   Procedure: POST PARTUM TUBAL LIGATION;  Surgeon: Tereso Newcomer, MD;  Location: [facility];  Service: Gynecology;  Laterality: Bilateral;    Allergies: Patient has no known allergies.  Medications: Prior to Admission medications   Medication Sig Start Date End Date Taking? Authorizing Provider  ferrous sulfate 325 (65 FE) MG EC tablet Take 325 mg by mouth 3 (three) times daily with meals.    [provider]  folic acid (FOLVITE) 1 MG tablet Take 2 tablets (2 mg total) by mouth daily. 06/28/21   Erenest Blank, NP  lactulose (CHRONULAC) 10 GM/15ML solution Take 15 mLs (10 g total) by  mouth 3 (three) times daily as needed for mild constipation. 12/14/21   Celso Amy, NP  rivaroxaban (XARELTO) 20 MG TABS tablet Take 1 tablet (20 mg total) by mouth daily with supper. 12/14/21   Celso Amy, NP     Family History  Problem Relation Age of Onset   Hypertension Mother    Diabetes Maternal Grandfather     Social History   Socioeconomic History    Marital status: Single    Spouse name: Not on file   Number of children: Not on file   Years of education: Not on file   Highest education level: Not on file  Occupational History   Not on file  Tobacco Use   Smoking status: Former    Packs/day: 0.50    Types: Cigarettes    Quit date: 02/09/2014    Years since quitting: 8.0   Smokeless tobacco: Never  Vaping Use   Vaping Use: Former  Substance and Sexual Activity   Alcohol use: No    Comment: socially   Drug use: No   Sexual activity: Yes    Birth control/protection: None    Comment: last sex one week ago   Other Topics Concern   Not on file  Social History Narrative   Not on file   Social Determinants of Health   Financial Resource Strain: Not on file  Food Insecurity: Not on file  Transportation Needs: Not on file  Physical Activity: Not on file  Stress: Not on file  Social Connections: Not on file     Review of Systems: A 12 point ROS discussed and pertinent positives are indicated in the HPI above.  All other systems are negative.  Review of Systems  Vital Signs: There were no vitals taken for this visit.    Physical Exam Constitutional:      Appearance: Normal appearance.  HENT:     Head: Normocephalic and atraumatic.  Eyes:     General: No scleral icterus. Cardiovascular:     Rate and Rhythm: Normal rate.  Pulmonary:     Effort: Pulmonary effort is normal.  Skin:    General: Skin is warm and dry.  Neurological:     Mental Status: She is alert and oriented to person, place, and time.  Psychiatric:        Mood and Affect: Mood normal.        Behavior: Behavior normal.    Imaging: No results found.  Labs:  CBC: Recent Labs    06/10/21 0003 06/21/21 1027 08/30/21 0859 12/14/21 1341  WBC 7.7 7.3 6.1 8.1  HGB 9.7* 10.2* 10.5* 11.0*  HCT 31.3* 32.6* 34.5* 34.6*  PLT 361 358 376 385    COAGS: No results for input(s): "INR", "APTT" in the last 8760 hours.  BMP: Recent Labs     06/10/21 0003 06/21/21 1027 08/30/21 0859 12/14/21 1341  NA 137 136 138 138  K 3.9 3.8 4.0 4.1  CL 104 104 105 104  CO2 26 24 26 26   GLUCOSE 88 87 90 93  BUN 10 12 9 7   CALCIUM 8.9 9.3 9.4 9.8  CREATININE 0.70 0.68 0.70 0.68  GFRNONAA >60 >60 >60 >60    LIVER FUNCTION TESTS: Recent Labs    06/10/21 0003 06/21/21 1027 08/30/21 0859 12/14/21 1341  BILITOT 0.3 0.4 0.3 0.4  AST 14* 16 13* 14*  ALT 11 13 10 9   ALKPHOS 40 46 53 57  PROT 7.0 7.7 7.0 7.5  ALBUMIN  4.0 4.3 4.3 4.5    TUMOR MARKERS: No results for input(s): "AFPTM", "CEA", "CA199", "CHROMGRNA" in the last 8760 hours.  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  She has done well on anticoagulants and has only minimal symptoms.  Her primary complaint today is constipation.  She is very concerned about the thrombus and is highly interested in knowing if the thrombus has resolved and if it still appears that she has enlargement of the left gonadal and parametrial veins.  She says she thinks about this every day and wants imaging follow-up so that she knows what is going on.  1.) CT abd/pelvis with contrast including portal venous and delayed (90 second) images to be followed with a televisit to discuss the results.   Electronically Signed: Criselda Peaches 02/14/2022, 1:27 PM   I spent a total of  15 Minutes in face to face in clinical consultation, greater than 50% of which was counseling/coordinating care for left internal iliac vein DVT.

## 2022-02-23 ENCOUNTER — Ambulatory Visit (HOSPITAL_BASED_OUTPATIENT_CLINIC_OR_DEPARTMENT_OTHER): Admission: RE | Admit: 2022-02-23 | Payer: Medicaid Other | Source: Ambulatory Visit

## 2022-02-24 ENCOUNTER — Encounter (HOSPITAL_BASED_OUTPATIENT_CLINIC_OR_DEPARTMENT_OTHER): Payer: Self-pay

## 2022-02-24 ENCOUNTER — Ambulatory Visit (HOSPITAL_BASED_OUTPATIENT_CLINIC_OR_DEPARTMENT_OTHER): Admission: RE | Admit: 2022-02-24 | Payer: Medicaid Other | Source: Ambulatory Visit

## 2022-03-08 ENCOUNTER — Ambulatory Visit (HOSPITAL_COMMUNITY)
Admission: RE | Admit: 2022-03-08 | Discharge: 2022-03-08 | Disposition: A | Discharge status: Home / Self Care | Payer: Medicaid Other | Source: Ambulatory Visit | Attending: Interventional Radiology | Admitting: Interventional Radiology

## 2022-03-08 DIAGNOSIS — N9489 Other specified conditions associated with female genital organs and menstrual cycle: Secondary | ICD-10-CM | POA: Insufficient documentation

## 2022-03-08 MED ORDER — IOHEXOL 350 MG/ML SOLN
100.0000 mL | Freq: Once | INTRAVENOUS | Status: AC | PRN
  Administered 2022-03-08: 100 mL via INTRAVENOUS

## 2022-03-10 ENCOUNTER — Other Ambulatory Visit: Payer: Self-pay | Admitting: Interventional Radiology

## 2022-03-10 DIAGNOSIS — N9489 Other specified conditions associated with female genital organs and menstrual cycle: Secondary | ICD-10-CM

## 2022-03-14 ENCOUNTER — Ambulatory Visit
Admission: RE | Admit: 2022-03-14 | Discharge: 2022-03-14 | Disposition: A | Discharge status: Home / Self Care | Payer: Medicaid Other | Source: Ambulatory Visit | Attending: Interventional Radiology | Admitting: Interventional Radiology

## 2022-03-14 ENCOUNTER — Telehealth (HOSPITAL_COMMUNITY): Payer: Self-pay | Admitting: Interventional Radiology

## 2022-03-14 DIAGNOSIS — N9489 Other specified conditions associated with female genital organs and menstrual cycle: Secondary | ICD-10-CM

## 2022-03-14 HISTORY — PX: IR RADIOLOGIST EVAL & MGMT: IMG5224

## 2022-03-14 NOTE — Telephone Encounter (Signed)
I spoke to Alicia Branch over the phone today.  We reviewed the results of her CT venogram of the abdomen and pelvis.  She was very pleased to hear that the thrombus within the left internal iliac venous collaterals has completely resolved and there is no evidence of new thrombus.  Further, she was happy to hear that while some imaging evidence of left renal vein compression and left ovarian venous reflux was evident, these findings are not definitive or egregious in their severity.  She continues to do well clinically and remains essentially asymptomatic.  No need for further follow-up with me at this time.  She will check in with her doctor who is prescribing her the blood thinner at their next scheduled visit to discuss potentially coming off of the blood thinner.  Signed,  Criselda Peaches, MD  Pager: [phone] Clinic: [phone]

## 2022-03-17 ENCOUNTER — Inpatient Hospital Stay: Payer: Medicaid Other | Admitting: Family

## 2022-03-17 ENCOUNTER — Inpatient Hospital Stay: Payer: Medicaid Other

## 2022-03-28 ENCOUNTER — Inpatient Hospital Stay: Payer: Medicaid Other | Admitting: Family

## 2022-03-28 ENCOUNTER — Inpatient Hospital Stay: Payer: Medicaid Other

## 2022-04-07 ENCOUNTER — Inpatient Hospital Stay: Payer: Medicaid Other | Attending: Hematology & Oncology

## 2022-04-07 ENCOUNTER — Inpatient Hospital Stay: Payer: Medicaid Other | Admitting: Family

## 2022-04-12 ENCOUNTER — Encounter: Payer: Self-pay | Admitting: Hematology & Oncology

## 2022-06-09 ENCOUNTER — Inpatient Hospital Stay: Payer: Medicaid Other | Attending: Hematology & Oncology

## 2022-06-09 ENCOUNTER — Inpatient Hospital Stay: Payer: Medicaid Other | Admitting: Family

## 2022-09-12 ENCOUNTER — Inpatient Hospital Stay: Payer: Medicaid Other | Attending: Hematology & Oncology

## 2022-09-12 ENCOUNTER — Inpatient Hospital Stay: Payer: Medicaid Other | Admitting: Medical Oncology

## 2022-10-12 ENCOUNTER — Inpatient Hospital Stay: Payer: Medicaid Other

## 2022-10-12 ENCOUNTER — Inpatient Hospital Stay: Payer: Medicaid Other | Admitting: Medical Oncology

## 2022-10-17 ENCOUNTER — Inpatient Hospital Stay: Payer: Medicaid Other | Admitting: Medical Oncology

## 2022-10-17 ENCOUNTER — Inpatient Hospital Stay: Payer: Medicaid Other | Attending: Hematology & Oncology

## 2022-10-20 ENCOUNTER — Encounter: Payer: Self-pay | Admitting: Medical Oncology

## 2022-12-25 ENCOUNTER — Other Ambulatory Visit: Payer: Self-pay | Admitting: Medical Oncology

## 2022-12-25 ENCOUNTER — Inpatient Hospital Stay: Payer: Medicaid Other | Admitting: Medical Oncology

## 2022-12-25 ENCOUNTER — Inpatient Hospital Stay: Payer: Medicaid Other | Attending: Hematology & Oncology

## 2022-12-25 DIAGNOSIS — D509 Iron deficiency anemia, unspecified: Secondary | ICD-10-CM

## 2022-12-25 DIAGNOSIS — E7211 Homocystinuria: Secondary | ICD-10-CM

## 2022-12-25 DIAGNOSIS — D6859 Other primary thrombophilia: Secondary | ICD-10-CM

## 2023-01-08 ENCOUNTER — Encounter (HOSPITAL_COMMUNITY): Payer: Self-pay

## 2023-01-08 ENCOUNTER — Ambulatory Visit (INDEPENDENT_AMBULATORY_CARE_PROVIDER_SITE_OTHER): Payer: Medicaid Other

## 2023-01-08 ENCOUNTER — Ambulatory Visit (HOSPITAL_COMMUNITY)
Admission: EM | Admit: 2023-01-08 | Discharge: 2023-01-08 | Disposition: A | Discharge status: Home / Self Care | Payer: Medicaid Other | Attending: Physician Assistant | Admitting: Physician Assistant

## 2023-01-08 DIAGNOSIS — M25511 Pain in right shoulder: Secondary | ICD-10-CM

## 2023-01-08 DIAGNOSIS — M79644 Pain in right finger(s): Secondary | ICD-10-CM | POA: Diagnosis not present

## 2023-01-08 MED ORDER — NAPROXEN 500 MG PO TABS: 500.0000 mg | ORAL_TABLET | Freq: Two times a day (BID) | ORAL | 0 refills | Status: AC

## 2023-01-08 NOTE — Discharge Instructions (Addendum)
Continue with finger splint for comfort Recommend follow up with ortho regarding shoulder

## 2023-01-08 NOTE — ED Triage Notes (Signed)
Finger Pain, Rt Shoulder Pain onset August but getting worse. Patient states she is in the process of moving and the pain got worse. Patient states original pain is from an injury.   Patient has not taken any pain meds.
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
## 2023-01-10 ENCOUNTER — Inpatient Hospital Stay: Payer: Medicaid Other | Admitting: Medical Oncology

## 2023-01-10 ENCOUNTER — Inpatient Hospital Stay: Payer: Medicaid Other

## 2023-02-27 ENCOUNTER — Other Ambulatory Visit: Payer: Self-pay | Admitting: Interventional Radiology

## 2023-02-27 DIAGNOSIS — N9489 Other specified conditions associated with female genital organs and menstrual cycle: Secondary | ICD-10-CM

## 2023-03-08 ENCOUNTER — Other Ambulatory Visit: Payer: Medicaid Other

## 2023-03-22 ENCOUNTER — Ambulatory Visit
Admission: RE | Admit: 2023-03-22 | Discharge: 2023-03-22 | Disposition: A | Discharge status: Home / Self Care | Payer: Medicaid Other | Source: Ambulatory Visit | Attending: Interventional Radiology | Admitting: Interventional Radiology

## 2023-03-22 DIAGNOSIS — N9489 Other specified conditions associated with female genital organs and menstrual cycle: Secondary | ICD-10-CM

## 2023-05-16 ENCOUNTER — Ambulatory Visit (HOSPITAL_COMMUNITY): Admission: EM | Admit: 2023-05-16 | Discharge: 2023-05-16 | Disposition: A | Discharge status: Home / Self Care

## 2023-11-13 ENCOUNTER — Telehealth: Payer: Self-pay | Admitting: Medical Oncology

## 2023-11-13 NOTE — Telephone Encounter (Signed)
 Pt called in requesting appt. Pt has not been seen since 2023 and has 10 no-shows/cancellations in 2024. Spoke with Katie Danner who instructed to give pt one more opportunity for scheduling. If pt no-shows upcoming appt, she will be dismissed from practice. Pt expressed understanding.

## 2023-11-21 ENCOUNTER — Inpatient Hospital Stay

## 2023-11-21 ENCOUNTER — Inpatient Hospital Stay: Admitting: Medical Oncology

## 2023-11-21 ENCOUNTER — Telehealth: Payer: Self-pay | Admitting: Medical Oncology

## 2023-11-21 NOTE — Telephone Encounter (Signed)
 pt called to r/s same day. Went ahead and rescheduled the pt, but advised that we do require a 24 hr notice for reschedule. Pt was in full understanding.

## 2023-11-28 IMAGING — CT CT ABD-PELV W/ CM
2 of 4 series · 16 of 46 positions shown, 18 images · IV contrast (APPLIED)
Comparison: None Available.

CLINICAL DATA: Left lower quadrant abdominal pain, left flank pain

EXAM:
CT ABDOMEN AND PELVIS WITH CONTRAST
TECHNIQUE: Multidetector CT imaging of the abdomen and pelvis was performed
using the standard protocol following bolus administration of
intravenous contrast.

[Series 2: abd pel w · axial · 0.78mm/px · z∈[+737,+1137]mm · 13 of 88 slices shown, 15 images]
[im 4/88  soft-tissue]
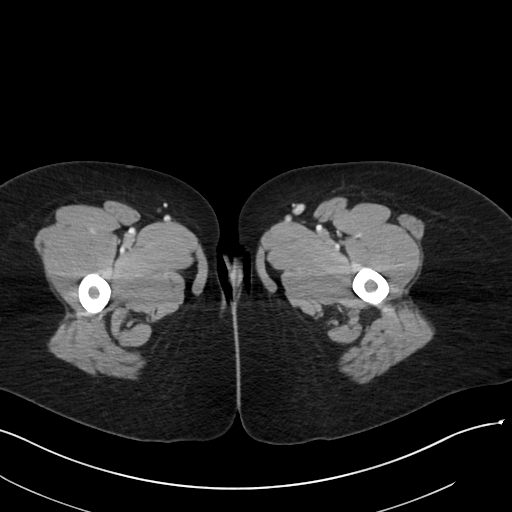
[im 4/88  bone]
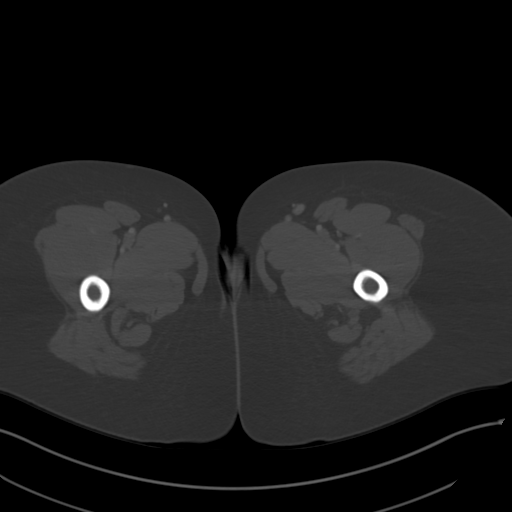
[im 11/88  soft-tissue]
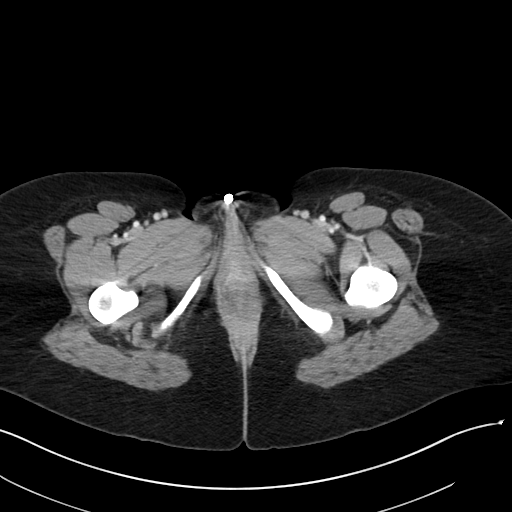
[im 17/88  soft-tissue]
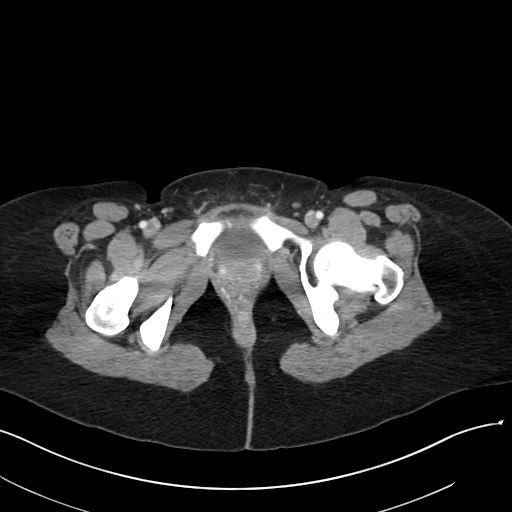
[im 24/88  soft-tissue]
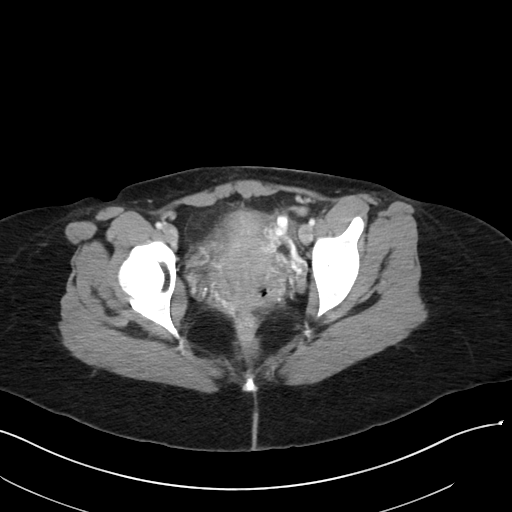
[im 31/88  soft-tissue]
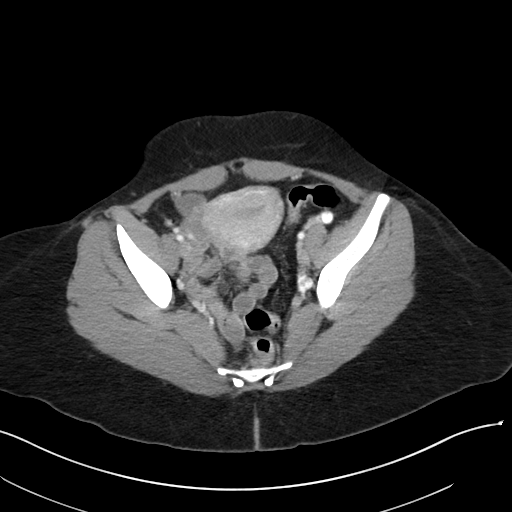
[im 37/88  soft-tissue]
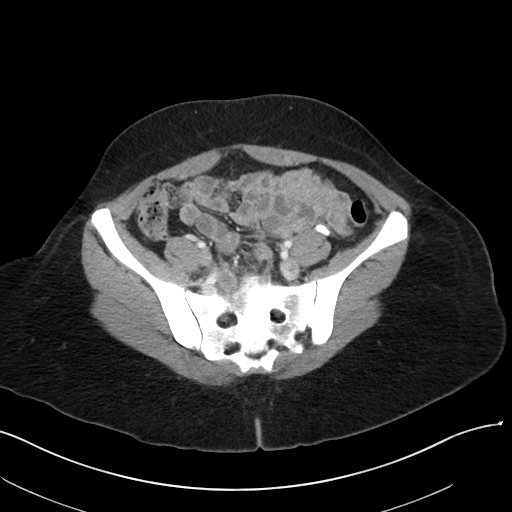
[im 44/88  soft-tissue]
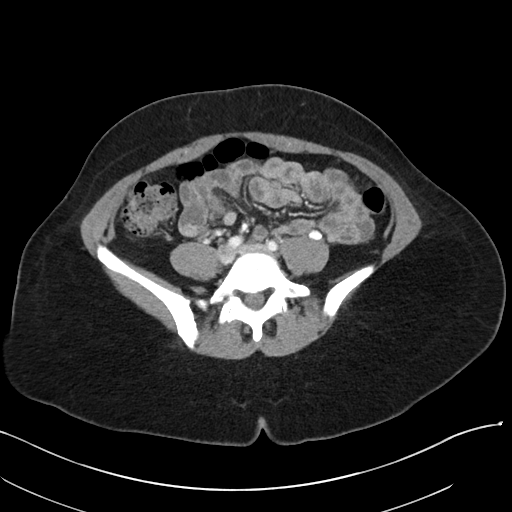
[im 51/88  soft-tissue]
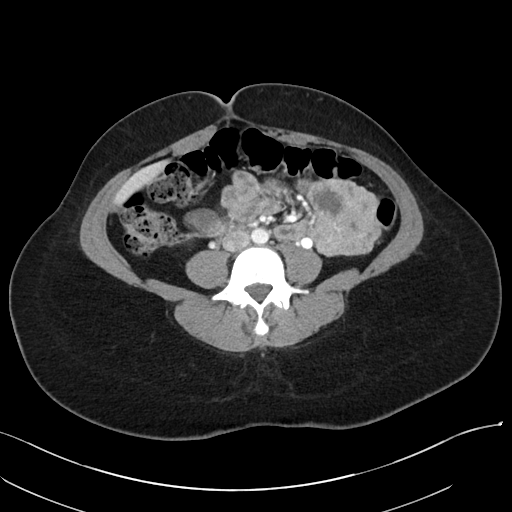
[im 57/88  soft-tissue]
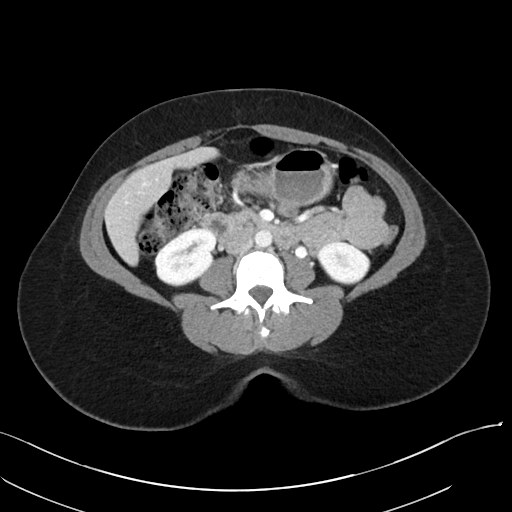
[im 57/88  bone]
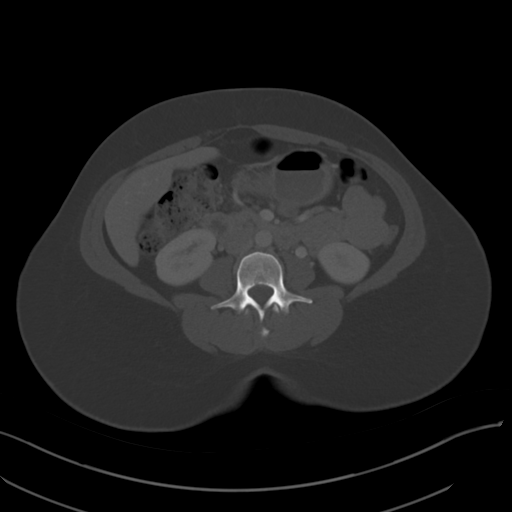
[im 64/88  soft-tissue]
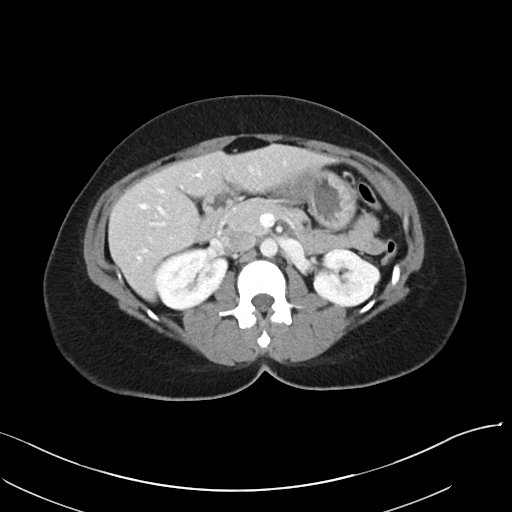
[im 71/88  soft-tissue]
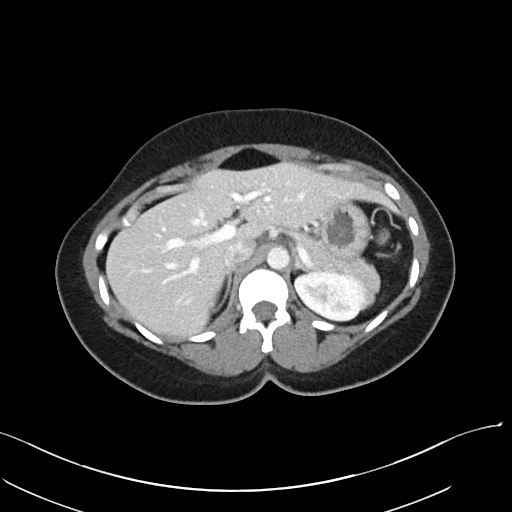
[im 77/88  soft-tissue]
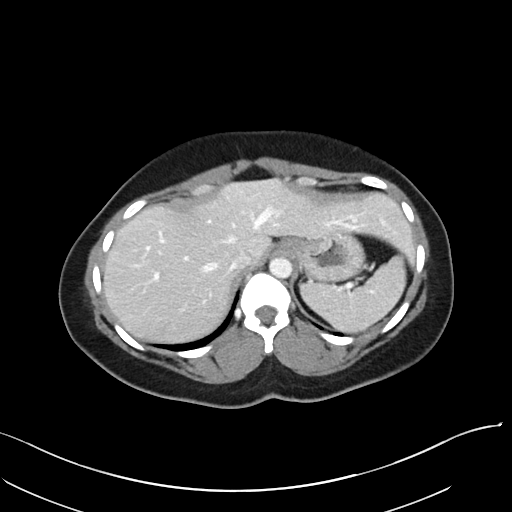
[im 84/88  soft-tissue]
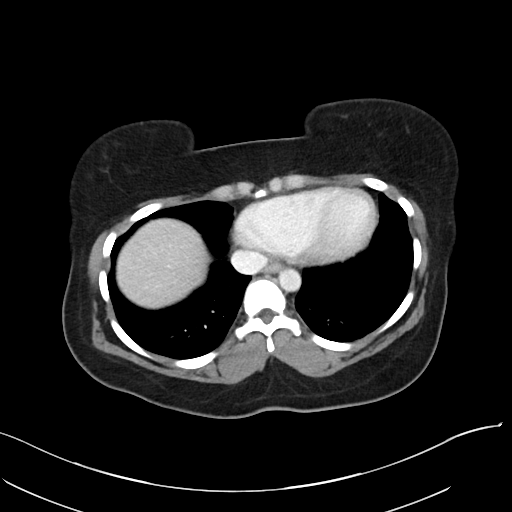

[Series 5: coronal · coronal · 0.89mm/px · 3 of 97 slices shown]
[im 33/97  soft-tissue]
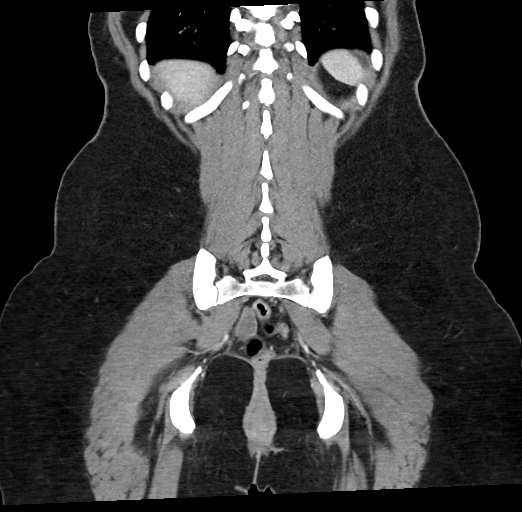
[im 43/97  soft-tissue]
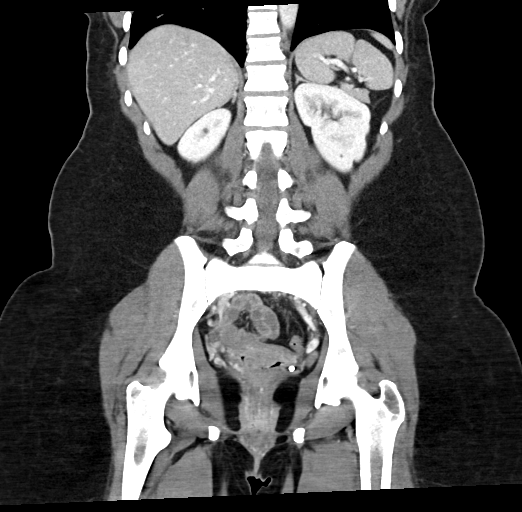
[im 54/97  soft-tissue]
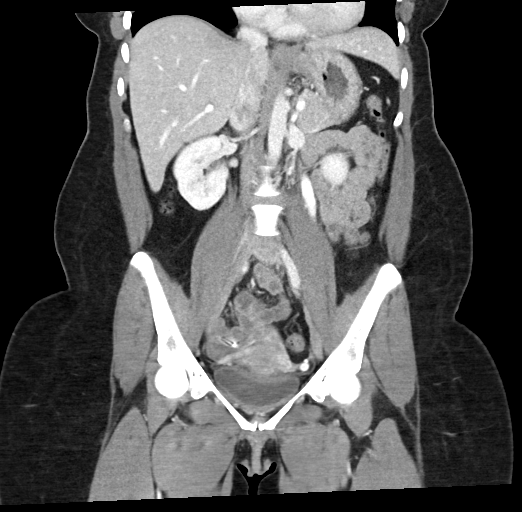

[16 of 46 positions shown; findings below may reference images not displayed]

RADIATION DOSE REDUCTION: This exam was performed according to the
departmental dose-optimization program which includes automated
exposure control, adjustment of the mA and/or kV according to
patient size and/or use of iterative reconstruction technique.

CONTRAST:  100mL OMNIPAQUE IOHEXOL 300 MG/ML  SOLN
FINDINGS: Lower chest: No acute abnormality.

Hepatobiliary: No focal liver abnormality is seen. Status post
cholecystectomy. No biliary dilatation.

Pancreas: Unremarkable

Spleen: Unremarkable

Adrenals/Urinary Tract: Adrenal glands are unremarkable. Kidneys are
normal, without renal calculi, focal lesion, or hydronephrosis.
Bladder is unremarkable.

Stomach/Bowel: Stomach is within normal limits. Appendix appears
normal. No evidence of bowel wall thickening, distention, or
inflammatory changes.

Vascular/Lymphatic: The left gonadal vein is dilated and there are
numerous left adnexal varices possibly reflecting changes of ovarian
vein reflux and pelvic venous insufficiency. There is mass effect
upon the left renal vein as it crosses between the superior
mesenteric artery and infrarenal abdominal aorta and, alternatively,
left gonadal venous distension may relate to a secondary venous
collateral as can be seen with nutcracker syndrome. There is a
central intraluminal filling defect identified within an opacified
varix within the left hemipelvis compatible with a nonocclusive
thrombus. The abdominal vasculature is otherwise unremarkable.

No pathologic adenopathy within the abdomen and pelvis.

Reproductive: Bilateral tubal ligation clips are in place. The
pelvic organs are otherwise unremarkable.

Other: No abdominal wall hernia.  Rectum unremarkable.

Musculoskeletal: No acute bone abnormality. No lytic or blastic bone
lesion.
IMPRESSION: Dilated left gonadal vein possibly reflecting changes of ovarian
vein reflux and pelvic venous insufficiency versus a venous
collateral in the setting of nutcracker syndrome related to mass
effect upon the central left renal vein. Superimposed nonocclusive
thrombus within several varices within the left hemipelvis.

## 2023-12-03 ENCOUNTER — Encounter: Payer: Self-pay | Admitting: Medical Oncology

## 2023-12-03 ENCOUNTER — Inpatient Hospital Stay: Admitting: Medical Oncology

## 2023-12-03 ENCOUNTER — Inpatient Hospital Stay: Attending: Hematology & Oncology

## 2023-12-03 DIAGNOSIS — I82890 Acute embolism and thrombosis of other specified veins: Secondary | ICD-10-CM | POA: Insufficient documentation

## 2023-12-03 DIAGNOSIS — Z862 Personal history of diseases of the blood and blood-forming organs and certain disorders involving the immune mechanism: Secondary | ICD-10-CM | POA: Insufficient documentation

## 2023-12-03 DIAGNOSIS — D6859 Other primary thrombophilia: Secondary | ICD-10-CM

## 2023-12-03 DIAGNOSIS — I871 Compression of vein: Secondary | ICD-10-CM | POA: Diagnosis not present

## 2023-12-03 DIAGNOSIS — I868 Varicose veins of other specified sites: Secondary | ICD-10-CM | POA: Diagnosis not present

## 2023-12-03 DIAGNOSIS — I8290 Acute embolism and thrombosis of unspecified vein: Secondary | ICD-10-CM

## 2023-12-03 DIAGNOSIS — E7211 Homocystinuria: Secondary | ICD-10-CM | POA: Diagnosis not present

## 2023-12-03 DIAGNOSIS — Z7901 Long term (current) use of anticoagulants: Secondary | ICD-10-CM | POA: Insufficient documentation

## 2023-12-03 DIAGNOSIS — D509 Iron deficiency anemia, unspecified: Secondary | ICD-10-CM

## 2023-12-03 LAB — RETIC PANEL
Immature Retic Fract: 13.3 % (ref 2.3–15.9)
RBC.: 3.48 MIL/uL — ABNORMAL LOW (ref 3.87–5.11)
Retic Count, Absolute: 37.2 K/uL (ref 19.0–186.0)
Retic Ct Pct: 1.1 % (ref 0.4–3.1)
Reticulocyte Hemoglobin: 34 pg (ref >27.9)

## 2023-12-03 LAB — CMP (CANCER CENTER ONLY)
ALT: <5 U/L (ref 0–44)
AST: 16 U/L (ref 15–41)
Albumin: 4.3 g/dL (ref 3.5–5.0)
Alkaline Phosphatase: 50 U/L (ref 38–126)
Anion gap: 11 (ref 5–15)
BUN: 7 mg/dL (ref 6–20)
CO2: 24 mmol/L (ref 22–32)
Calcium: 9.5 mg/dL (ref 8.9–10.3)
Chloride: 106 mmol/L (ref 98–111)
Creatinine: 0.62 mg/dL (ref 0.44–1.00)
GFR, Estimated: >60 mL/min (ref >60)
Glucose, Bld: 83 mg/dL (ref 70–99)
Potassium: 4.3 mmol/L (ref 3.5–5.1)
Sodium: 140 mmol/L (ref 135–145)
Total Bilirubin: 0.3 mg/dL (ref 0.0–1.2)
Total Protein: 7.1 g/dL (ref 6.5–8.1)

## 2023-12-03 LAB — IRON AND IRON BINDING CAPACITY (CC-WL,HP ONLY)
Iron: 27 ug/dL — ABNORMAL LOW (ref 28–170)
Saturation Ratios: 7 % — ABNORMAL LOW (ref 10.4–31.8)
TIBC: 386 ug/dL (ref 250–450)
UIBC: 359 ug/dL

## 2023-12-03 LAB — CBC WITH DIFFERENTIAL (CANCER CENTER ONLY)
Abs Immature Granulocytes: 0.03 K/uL (ref 0.00–0.07)
Basophils Absolute: 0 K/uL (ref 0.0–0.1)
Basophils Relative: 1 %
Eosinophils Absolute: 0.1 K/uL (ref 0.0–0.5)
Eosinophils Relative: 2 %
HCT: 33.9 % — ABNORMAL LOW (ref 36.0–46.0)
Hemoglobin: 10.9 g/dL — ABNORMAL LOW (ref 12.0–15.0)
Immature Granulocytes: 1 %
Lymphocytes Relative: 37 %
Lymphs Abs: 2.2 K/uL (ref 0.7–4.0)
MCH: 31.1 pg (ref 26.0–34.0)
MCHC: 32.2 g/dL (ref 30.0–36.0)
MCV: 96.9 fL (ref 80.0–100.0)
Monocytes Absolute: 0.4 K/uL (ref 0.1–1.0)
Monocytes Relative: 7 %
Neutro Abs: 3.2 K/uL (ref 1.7–7.7)
Neutrophils Relative %: 52 %
Platelet Count: 352 K/uL (ref 150–400)
RBC: 3.5 MIL/uL — ABNORMAL LOW (ref 3.87–5.11)
RDW: 13.9 % (ref 11.5–15.5)
WBC Count: 6 K/uL (ref 4.0–10.5)
nRBC: 0 % (ref 0.0–0.2)

## 2023-12-03 LAB — FERRITIN: Ferritin: 11 ng/mL (ref 11–307)

## 2023-12-03 MED ORDER — RIVAROXABAN 20 MG PO TABS: 20.0000 mg | ORAL_TABLET | Freq: Every day | ORAL | 6 refills | Status: AC

## 2023-12-03 MED ORDER — FOLIC ACID 1 MG PO TABS: 2.0000 mg | ORAL_TABLET | Freq: Every day | ORAL | 11 refills | Status: AC

## 2023-12-03 NOTE — Progress Notes (Signed)
 Hematology and Oncology Follow Up Visit  Alicia Branch [id number] [birth year] 33 y.o. 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   She states that she has been doing well. She denies any recent constipation.  She reports having stopped all of her medications a few months ago. She asks if she needs to restart the Xarelto .  No fever, chills, vomiting, cough, rash, dizziness, SOB, chest pain, palpitations or changes in bladder habits.  No blood loss noted. No bruising or petechiae.  No swelling, tenderness, numbness or tingling in her extremities.  No falls or syncope.  She has completed some diet changes to include less meat for to help with reduce inflammation  Wt Readings from Last 3 Encounters:  12/03/23 136 lb 1.9 oz (61.7 kg)  01/08/23 167 lb (75.8 kg)  12/14/21 159 lb (72.1 kg)   ECOG Performance Status: 1 - Symptomatic but completely ambulatory  Medications:  Allergies as of 12/03/2023   No Known Allergies      Medication List        Accurate as of December 03, 2023 12:06 PM. If you have any questions, ask your nurse or doctor.          ferrous sulfate  325 (65 FE) MG EC tablet Take 325 mg by mouth 3 (three) times daily with meals.   folic acid  1 MG tablet Commonly known as: FOLVITE  Take 2 tablets (2 mg total) by mouth daily.   lactulose  10 GM/15ML solution Commonly known as: CHRONULAC  Take 15 mLs (10 g total) by mouth 3 (three) times daily as needed for mild constipation.   naproxen  500 MG tablet Commonly known as: NAPROSYN  Take 1 tablet (500 mg total) by mouth 2 (two) times daily.   rivaroxaban  20 MG Tabs tablet Commonly known as: XARELTO  Take 1  tablet (20 mg total) by mouth daily with supper.        Allergies: No Known Allergies  Past Medical History, Surgical history, Social history, and Family History were reviewed and updated.  Review of Systems: All other 10 point review of systems is negative.   Physical Exam:  height is 5' 1 (1.549 m) and weight is 136 lb 1.9 oz (61.7 kg). Her oral temperature is 98.4 F (36.9 C). Her blood pressure is 107/72 and her pulse is 72. Her respiration is 2 (abnormal) and oxygen saturation is 100%.   Wt Readings from Last 3 Encounters:  12/03/23 136 lb 1.9 oz (61.7 kg)  01/08/23 167 lb (75.8 kg)  12/14/21 159 lb (72.1 kg)    Ocular: Sclerae unicteric, pupils equal, round and reactive to light Ear-nose-throat: Oropharynx clear, dentition fair Lymphatic: No cervical or supraclavicular adenopathy Lungs no rales or rhonchi, good excursion bilaterally Heart regular rate and rhythm, no murmur appreciated Abd soft, nontender, positive bowel sounds MSK no focal spinal tenderness, no joint edema Neuro: non-focal, well-oriented, appropriate affect  Lab Results  Component Value Date   WBC 6.0 12/03/2023   HGB 10.9 (L) 12/03/2023   HCT 33.9 (L) 12/03/2023   MCV 96.9 12/03/2023   PLT 352 12/03/2023   Lab Results  Component Value Date   FERRITIN 5 (L) 12/14/2021   IRON  85 12/14/2021   TIBC 456 (H) 12/14/2021   UIBC 371 12/14/2021   IRONPCTSAT 19 12/14/2021   Lab Results  Component Value Date   RETICCTPCT 1.1 12/03/2023   RBC 3.48 (L) 12/03/2023   No results found for: KPAFRELGTCHN, LAMBDASER, KAPLAMBRATIO No results found for: IGGSERUM, IGA, IGMSERUM No results found for: Alicia Branch, A1GS, A2GS, Alicia Branch, SPEI   Chemistry      Component Value Date/Time   NA 138 12/14/2021 1341   K 4.1 12/14/2021 1341   CL 104 12/14/2021 1341   CO2 26 12/14/2021 1341   BUN 7 12/14/2021 1341   CREATININE 0.68 12/14/2021 1341       Component Value Date/Time   CALCIUM 9.8 12/14/2021 1341   ALKPHOS 57 12/14/2021 1341   AST 14 (L) 12/14/2021 1341   ALT 9 12/14/2021 1341   BILITOT 0.4 12/14/2021 1341     Encounter Diagnoses  Name Primary?   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 She was also found to have an elevated homocystine level.   We discussed her history of blood clots and elevated homocysteine level. It is recommended given this history that she continue on chronic anticoagulation to reduce the risk of additional clotting events.  She will restart her regimen with Xarelto  and daily folic acid .  Iron studies are pending.   RTC 6 months Alicia Branch, labs   Alicia Branch, [place] 11/10/202512:06 PM

## 2023-12-03 NOTE — Patient Instructions (Signed)
 You have had an elevated homocysteine level along with blood clots- for this reason we recommend that you stay on a blood thinner.

## 2023-12-04 ENCOUNTER — Ambulatory Visit: Payer: Self-pay | Admitting: Medical Oncology

## 2023-12-04 ENCOUNTER — Other Ambulatory Visit: Payer: Self-pay | Admitting: Medical Oncology

## 2023-12-13 ENCOUNTER — Inpatient Hospital Stay

## 2023-12-13 VITALS — BP 104/55 | HR 75 | Temp 98.7°F | Resp 18

## 2023-12-13 DIAGNOSIS — I82890 Acute embolism and thrombosis of other specified veins: Secondary | ICD-10-CM | POA: Diagnosis not present

## 2023-12-13 DIAGNOSIS — D509 Iron deficiency anemia, unspecified: Secondary | ICD-10-CM

## 2023-12-13 MED ORDER — SODIUM CHLORIDE 0.9 % IV SOLN
1000.0000 mg | Freq: Once | INTRAVENOUS | Status: AC
  Administered 2023-12-13: 1000 mg via INTRAVENOUS
  Filled 2023-12-13: qty 10

## 2023-12-13 MED ORDER — SODIUM CHLORIDE 0.9 % IV SOLN: Freq: Once | INTRAVENOUS | Status: AC

## 2023-12-13 MED ORDER — SODIUM CHLORIDE 0.9 % IV SOLN: INTRAVENOUS | Status: DC

## 2023-12-13 NOTE — Patient Instructions (Signed)

## 2023-12-17 ENCOUNTER — Encounter: Payer: Self-pay | Admitting: Gastroenterology

## 2023-12-31 ENCOUNTER — Ambulatory Visit: Admitting: Gastroenterology

## 2024-01-31 ENCOUNTER — Telehealth (INDEPENDENT_AMBULATORY_CARE_PROVIDER_SITE_OTHER): Payer: Self-pay | Admitting: *Deleted

## 2024-01-31 ENCOUNTER — Encounter: Payer: Self-pay | Admitting: Gastroenterology

## 2024-01-31 ENCOUNTER — Ambulatory Visit (INDEPENDENT_AMBULATORY_CARE_PROVIDER_SITE_OTHER): Admitting: Gastroenterology

## 2024-01-31 VITALS — BP 103/68 | HR 78 | Temp 98.8°F | Ht 61.0 in | Wt 133.4 lb

## 2024-01-31 DIAGNOSIS — R634 Abnormal weight loss: Secondary | ICD-10-CM

## 2024-01-31 DIAGNOSIS — D509 Iron deficiency anemia, unspecified: Secondary | ICD-10-CM | POA: Diagnosis not present

## 2024-01-31 DIAGNOSIS — R10A2 Flank pain, left side: Secondary | ICD-10-CM | POA: Diagnosis not present

## 2024-01-31 DIAGNOSIS — K219 Gastro-esophageal reflux disease without esophagitis: Secondary | ICD-10-CM | POA: Diagnosis not present

## 2024-01-31 DIAGNOSIS — I871 Compression of vein: Secondary | ICD-10-CM

## 2024-01-31 DIAGNOSIS — Z86718 Personal history of other venous thrombosis and embolism: Secondary | ICD-10-CM

## 2024-01-31 NOTE — Patient Instructions (Addendum)
 We are getting you scheduled for a special CT of your abdomen pelvis to evaluate the blood flow and to make sure you do not have any recurrent clot in your renal veins and see if you have ongoing compression.  This is also to evaluate for a potential causing weight loss.  We will reach out to you to get you scheduled for your upper endoscopy as well, you only need to do a urine pregnancy test prior to this.  If you get started on any blood thinners between now and your upper endoscopy you should let us  know immediately.  I have ordered additional lab work for you to reassess your iron levels since you have had iron infusion in November and to further evaluate for any other potential causes for your anemia and your weight loss.  I do want you to start taking an over-the-counter oral iron.  I want you to take 1 tablet daily with vitamin C and with food.  You may get a combination iron with vitamin C or drink with orange juice or eat something when you take it.  If you are having nausea then I recommend start taking it at nighttime before bed.  If you are having any frequent reflux symptoms then you can take famotidine 20 mg once daily over-the-counter as needed.  I want you to monitor for any black/tarry stools or bright red blood in your stools or changes in bowel habits.  For any mild constipation you can take some over-the-counter fiber supplementation like Metamucil and capsule form or powder form and just follow the directions on the bottle but start with once daily.  Please continue drinking your ensures.  If you are not eating meat then I recommend increasing the intake of higher protein vegetables and other sources to help with weight gain.  I will be in touch with you with the results of your lab work once received and we will notify you if any other recommendations are needed.  It was a pleasure to see you today. I want to create trusting relationships with patients. If you receive a survey  regarding your visit,  I greatly appreciate you taking time to fill this out on paper or through your MyChart. I value your feedback.  Charmaine Melia, MSN, FNP-BC, AGACNP-BC [facility]

## 2024-01-31 NOTE — Progress Notes (Unsigned)
 "  GI Office Note    Referring Provider: Suzie Rosaline PEDLAR, NP Primary Care Physician:  Suzie Rosaline PEDLAR, NP Primary Gastroenterologist: Carlin POUR. Cindie, DO  Date:  01/31/2024  ID:  Alicia Branch, DOB 12-Sep-1990, MRN [id number]   Chief Complaint   Chief Complaint  Patient presents with   Abdominal Pain    Lots of pressure on left lower side.    History of Present Illness  Alicia Branch is a 34 y.o. female with a history of DVT of the left pelvic region noted in 2023 supposed to be on Xarelto  and follows with hematology/oncology, anemia presenting today with complaint of left sided abdominal pain.  CT angio abdomen pelvis with and without contrast February 2024: Unremarkable CT of the abdomen pelvis.  Per review of referral paperwork she has been referred for left flank pain.  Labs in November 2025 with normal lipid panel, HIV nonreactive, syphilis screen negative.  She had an office visit in November with a hypercoagulable state, followed by oncology and hematology and had not yet started Xarelto  stating she needed mentally prepare for this.  Medication compliance was encouraged.  She had a CBC a week prior to her visit that showed some anemia and a normal CMP.  She stated that she was having muscle tightness that made it hard for her to breathe at times her left flank and she stated she was instructed by oncology/hematology to establish with GI but states the referral was never placed.  She stated the tightness in her left side had been present since her diagnosis of a blood clot in 2023.  She stated her oncologist recommended iron supplementation 2 weeks prior.  Family history of arthritis and asthma as well as maternal grandparents with high blood pressure and diabetes.  She has had a gallbladder removal in 2016.  Per review of care everywhere I only have some results from 2023 with a hemoglobin of 10.4 and normal platelets as well as normal MCV.  Last iron panel in Care  Everywhere from 2022 with iron saturation low at 7%     Latest Ref Rng & Units 12/03/2023   11:27 AM 12/14/2021    1:41 PM 08/30/2021    8:59 AM  CBC  WBC 4.0 - 10.5 K/uL 6.0  8.1  6.1   Hemoglobin 12.0 - 15.0 g/dL 89.0  88.9  89.4   Hematocrit 36.0 - 46.0 % 33.9  34.6  34.5   Platelets 150 - 400 K/uL 352  385  376     Iron/TIBC/Ferritin/ %Sat    Component Value Date/Time   IRON 27 (L) 12/03/2023 1127   TIBC 386 12/03/2023 1127   FERRITIN 11 12/03/2023 1127   IRONPCTSAT 7 (L) 12/03/2023 1127    Today:   Discussed the use of AI scribe software for clinical note transcription with the patient, who gave verbal consent to proceed.  Iron deficiency anemia has been present since the birth of her last child 11 years ago. She has received intravenous iron infusions twice, most recently in November 2025, with no follow-up blood work since. Oral iron was prescribed but only taken once due to forgetfulness and uncertainty about her diagnosis. Blood thinners were taken for almost a year and then discontinued independently. Laboratory results from November 2025 showed iron saturation of 7, iron of 27, and ferritin of 11. No celiac disease testing has been performed.  Persistent left-sided abdominal pain is described as a pressure sensation. The patient noted recurrence of pressure  after discontinuing blood thinners. The pain is more pronounced on the back side of her abdomen. The patient reports that pain sometimes occurs with eating. History includes a blood clot in the pelvic or renal veins diagnosed in 2024. The patient recalls being told about compression of the left renal vein and absence of an active clot. 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 Intermittent constipation occurs, with several days between bowel movements, as well as occasional reflux and belching. No recent blood in stool or melena.  Significant unintentional weight loss has occurred, from  approximately 170 lbs in December 2024 to the 130 lb range as of November 2025. She has difficulty gaining weight despite efforts, and attributes some decreased appetite to recent cessation of daily marijuana use (stopped 6 days ago). Currently supplements her diet with Ensure shakes and does not eat meat. Appetite is beginning to return since stopping marijuana.  Menstrual cycles are not heavy but are notable for passage of thick blood clots during menses. No regular NSAID use, with only one recent dose for wisdom tooth pain. No regular use of Aleve , Advil , or ibuprofen .  Family history includes a maternal grandmother with blood clots and colon cancer, and a mother with breast cancer. She has four children.       Wt Readings from Last 6 Encounters:  01/31/24 133 lb 6.4 oz (60.5 kg)  12/03/23 136 lb 1.9 oz (61.7 kg)  01/08/23 167 lb (75.8 kg)  12/14/21 159 lb (72.1 kg)  08/30/21 174 lb (78.9 kg)  07/14/21 170 lb (77.1 kg)    Body mass index is 25.21 kg/m.   Current Outpatient Medications  Medication Sig Dispense Refill   amoxicillin (AMOXIL) 500 MG tablet Take 500 mg by mouth 3 (three) times daily.     ferrous sulfate  325 (65 FE) MG EC tablet Take 325 mg by mouth 3 (three) times daily with meals.     folic acid  (FOLVITE ) 1 MG tablet Take 2 tablets (2 mg total) by mouth daily. (Patient not taking: Reported on 01/31/2024) 60 tablet 11   lactulose  (CHRONULAC ) 10 GM/15ML solution Take 15 mLs (10 g total) by mouth 3 (three) times daily as needed for mild constipation. (Patient not taking: Reported on 01/31/2024) 946 mL 0   naproxen  (NAPROSYN ) 500 MG tablet Take 1 tablet (500 mg total) by mouth 2 (two) times daily. (Patient not taking: Reported on 01/31/2024) 30 tablet 0   rivaroxaban  (XARELTO ) 20 MG TABS tablet Take 1 tablet (20 mg total) by mouth daily with supper. (Patient not taking: Reported on 01/31/2024) 30 tablet 6   No current facility-administered medications for this visit.    Past  Medical History:  Diagnosis Date   Acute deep vein thrombosis (DVT) of left side of pelvic region    Anemia     Past Surgical History:  Procedure Laterality Date   CESAREAN SECTION     CHOLECYSTECTOMY N/A 06/02/2014   Procedure: LAPAROSCOPIC CHOLECYSTECTOMY WITH INTRAOPERATIVE CHOLANGIOGRAM;  Surgeon: Donnice Lunger, MD;  Location: [facility];  Service: General;  Laterality: N/A;   IR RADIOLOGIST EVAL & MGMT  08/16/2021   IR RADIOLOGIST EVAL & MGMT  03/14/2022   TUBAL LIGATION  10/05/2011   Procedure: POST PARTUM TUBAL LIGATION;  Surgeon: Gloris DELENA Hugger, MD;  Location: [facility];  Service: Gynecology;  Laterality: Bilateral;    Family History  Problem Relation Age of Onset   Hypertension Mother    Diabetes Maternal Grandfather  Allergies as of 01/31/2024   (No Known Allergies)    Social History   Socioeconomic History   Marital status: Single    Spouse name: Not on file   Number of children: Not on file   Years of education: Not on file   Highest education level: Not on file  Occupational History   Not on file  Tobacco Use   Smoking status: Former    Current packs/day: 0.00    Average packs/day: 0.5 packs/day    Types: Cigarettes    Quit date: 02/09/2014    Years since quitting: 9.9   Smokeless tobacco: Never  Vaping Use   Vaping status: Former  Substance and Sexual Activity   Alcohol use: No    Comment: socially   Drug use: No   Sexual activity: Yes    Birth control/protection: None    Comment: last sex one week ago   Other Topics Concern   Not on file  Social History Narrative   Not on file   Social Drivers of Health   Tobacco Use: Medium Risk (01/31/2024)   Patient History    Smoking Tobacco Use: Former    Smokeless Tobacco Use: Never    Passive Exposure: Not on file  Financial Resource Strain: Low Risk (12/11/2023)   Received from [facility]   Overall Financial Resource Strain (CARDIA)    How hard is it for you to pay for the very basics like food,  housing, medical care, and heating?: Not very hard  Food Insecurity: No Food Insecurity (12/11/2023)   Received [facility]   Epic    Within the past 12 months, you worried that your food would run out before you got the money to buy more.: Never true    Within the past 12 months, the food you bought just didn't last and you didn't have money to get more.: Never true  Transportation Needs: No Transportation Needs (12/11/2023)   Received from [facility]    In the past 12 months, has lack of transportation kept you from medical appointments or from getting medications?: No    In the past 12 months, has lack of transportation kept you from meetings, work, or from getting things needed for daily living?: No  Physical Activity: Not on file  Stress: Not on file  Social Connections: Not on file  Depression (PHQ2-9): Low Risk (12/03/2023)   Depression (PHQ2-9)    PHQ-2 Score: 0  Alcohol Screen: Not on file  Housing: Low Risk (12/11/2023)   Received from [facility]    In the last 12 months, was there a time when you were not able to pay the mortgage or rent on time?: No    In the past 12 months, how many times have you moved where you were living?: 0    At any time in the past 12 months, were you homeless or living in a shelter (including now)?: No  Utilities: Not At Risk (12/11/2023)   Received from [facility]    In the past 12 months has the electric, gas, oil, or water company threatened to shut off services in your home?: No  Health Literacy: Not on file    Review of Systems   Gen: Denies fever, chills, anorexia. Denies fatigue, weakness, weight loss.  CV: Denies chest pain, palpitations, syncope, peripheral edema, and claudication. Resp: Denies dyspnea at rest, cough, wheezing, coughing up blood, and pleurisy. GI: See HPI  Derm: Denies rash, itching, dry skin Psych: Denies depression, anxiety, memory loss, confusion. No homicidal or suicidal  ideation.  Heme: Denies bruising, bleeding, and enlarged lymph nodes.  Physical Exam   BP 103/68 (BP Location: Right Arm, Patient Position: Sitting, Cuff Size: Normal)   Pulse 78   Temp 98.8 F (37.1 C) (Temporal)   Ht 5' 1 (1.549 m)   Wt 133 lb 6.4 oz (60.5 kg)   LMP 01/21/2024 (Approximate)   BMI 25.21 kg/m   General:   Alert and oriented. No distress noted. Pleasant and cooperative.  Head:  Normocephalic and atraumatic. Eyes:  Conjuctiva clear without scleral icterus. Mouth:  Oral mucosa pink and moist. Good dentition. No lesions. Lungs:  Clear to auscultation bilaterally. No wheezes, rales, or rhonchi. No distress.  Heart:  S1, S2 present without murmurs appreciated.  Abdomen:  +BS, soft, non-tender and non-distended. No rebound or guarding. No HSM or masses noted. Rectal: deferred Msk:  Symmetrical without gross deformities. Normal posture. Extremities:  Without edema. Neurologic:  Alert and  oriented x4 Psych:  Alert and cooperative. Normal mood and affect.  Assessment & Plan  Alicia Branch is a 34 y.o. female presenting today for evaluation of anemia but also with unintentional weight loss and left sided abdominal pain.     Iron deficiency anemia Chronic, severe iron deficiency anemia with multifactorial etiology, including decreased dietary intake, gastrointestinal malabsorption, and possible hematologic factors. Persistent low iron indices and family history of colon cancer warrant further evaluation. We discussed endoscopic evaluation for her anemia nad weight loss and for now she would like to proceed with upper endoscopy only and perform colonoscopy if EGD unrevealing and deemed necessary.  - Provided education and printed visual aid regarding over-the-counter iron supplementation (65 mg elemental iron daily, with or without vitamin C, to be taken with food or at bedtime if gastrointestinal upset occurs). - Advised delaying initiation of oral iron until repeat  laboratory studies are completed to assess the effect of prior intravenous iron infusion. - Ordered repeat iron studies and laboratory evaluation for celiac disease and gluten sensitivity. - Recommended and discussed upper endoscopy to evaluate for gastrointestinal sources of blood loss or malabsorption; she agreed to proceed with scheduling. - Discussed that colonoscopy may be considered if anemia persists or if upper endoscopy is unrevealing, particularly given her family history of colon cancer.  Unintentional weight loss, lack of appetite Subacute, significant, and unexplained weight loss with ongoing decreased appetite, likely multifactorial with possible contributions from gastrointestinal pathology, malabsorption, or vascular compromise related to nutcracker syndrome. Appetite has improved following cessation of daily marijuana use. - Encouraged continued use of nutritional supplements (Ensure). - Discussed importance of dietary fiber for bowel regularity. - Ordered repeat CT angiogram of the abdomen and pelvis to reassess for vascular abnormalities, recurrent thrombosis, or worsening compression. - Discussed potential role of upper endoscopy to evaluate for malabsorption or gastrointestinal pathology contributing to weight loss and decreased appetite.  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 Symptoms may be exacerbated postprandially due to decreased mesenteric blood flow. - Ordered repeat CT angiogram of the abdomen and pelvis to evaluate for changes in vascular anatomy, recurrent thrombosis, or worsening compression. - Discussed pathophysiology of nutcracker syndrome and its relationship to pain and possible  contribution to weight loss. - Advised that further management will be guided by imaging and endoscopic findings.   Procedure: Proceed with upper endoscopy with propofol  by Dr. Cindie in near future: the risks, benefits, and alternatives have been discussed with the patient in detail. The patient states understanding and desires to proceed. ASA 2  UPT  If xarelto  started prior to procedure then would need clearance to hold 2 days prior.   Has had some mild constipation and recommended fiber supplementation.     Follow up   Follow up post procedure.   Charmaine Melia, MSN, FNP-BC, AGACNP-BC [facility] "

## 2024-01-31 NOTE — Telephone Encounter (Signed)
 [employer] PA for CTA: Request ID:Not Available Tracking: [id number] Request Date: 01/31/2024 11:51 AM Status: In Review

## 2024-02-01 ENCOUNTER — Encounter: Payer: Self-pay | Admitting: *Deleted

## 2024-02-01 ENCOUNTER — Other Ambulatory Visit: Payer: Self-pay | Admitting: *Deleted

## 2024-02-01 DIAGNOSIS — D509 Iron deficiency anemia, unspecified: Secondary | ICD-10-CM

## 2024-02-01 DIAGNOSIS — R634 Abnormal weight loss: Secondary | ICD-10-CM

## 2024-02-01 NOTE — Telephone Encounter (Signed)
 [employer] PA for EGD: Service Dates Procedure Code (Modifiers) Units Auth # 02/07/2024 - 05/08/2024  43235 1 Unit(s) [id number] NOT REQUIRED (Certification Not Required for this Service)

## 2024-02-01 NOTE — Telephone Encounter (Signed)
 [employer] for EGD:  Pending Authorization #: [id number] Effective: 02/01/2024

## 2024-02-02 LAB — PREGNANCY, URINE: Preg Test, Ur: NEGATIVE

## 2024-02-04 ENCOUNTER — Ambulatory Visit: Payer: Self-pay | Admitting: Gastroenterology

## 2024-02-05 LAB — CBC
Hematocrit: 38.6 % (ref 34.0–46.6)
Hemoglobin: 12.5 g/dL (ref 11.1–15.9)
MCH: 32.4 pg (ref 26.6–33.0)
MCHC: 32.4 g/dL (ref 31.5–35.7)
MCV: 100 fL — ABNORMAL HIGH (ref 79–97)
Platelets: 322 x10E3/uL (ref 150–450)
RBC: 3.86 x10E6/uL (ref 3.77–5.28)
RDW: 13.3 % (ref 11.7–15.4)
WBC: 5.4 x10E3/uL (ref 3.4–10.8)

## 2024-02-05 LAB — IRON,TIBC AND FERRITIN PANEL
Ferritin: 252 ng/mL — ABNORMAL HIGH (ref 15–150)
Iron Saturation: 23 % (ref 15–55)
Iron: 67 ug/dL (ref 27–159)
Total Iron Binding Capacity: 287 ug/dL (ref 250–450)
UIBC: 220 ug/dL (ref 131–425)

## 2024-02-05 LAB — B12 AND FOLATE PANEL
Folate: 9.9 ng/mL
Vitamin B-12: 342 pg/mL (ref 232–1245)

## 2024-02-05 LAB — CELIAC DISEASE PANEL
Immunoglobulin A, (IgA) QN, Serum: 208 mg/dL (ref 87–352)
t-Transglutaminase (tTG) IgA: <2 U/mL (ref 0–3)

## 2024-02-05 LAB — TSH+FREE T4
Free T4: 0.99 ng/dL (ref 0.82–1.77)
TSH: 0.908 u[IU]/mL (ref 0.450–4.500)

## 2024-02-05 LAB — HEPATITIS C ANTIBODY: Hep C Virus Ab: NONREACTIVE

## 2024-02-05 LAB — C-REACTIVE PROTEIN: CRP: <1 mg/L (ref 0–10)

## 2024-02-07 ENCOUNTER — Ambulatory Visit (HOSPITAL_COMMUNITY): Admission: RE | Admit: 2024-02-07 | Admitting: Internal Medicine

## 2024-02-07 ENCOUNTER — Encounter (HOSPITAL_COMMUNITY): Payer: Self-pay | Admitting: Anesthesiology

## 2024-02-07 ENCOUNTER — Telehealth: Payer: Self-pay | Admitting: *Deleted

## 2024-02-07 ENCOUNTER — Encounter (HOSPITAL_COMMUNITY): Admission: RE | Payer: Self-pay

## 2024-02-07 SURGERY — EGD (ESOPHAGOGASTRODUODENOSCOPY)
Anesthesia: Choice

## 2024-02-07 NOTE — Telephone Encounter (Signed)
 Per Anitra in endo pt did not know she needed a ride after her procedure needs rescheduling ASAP thanks

## 2024-02-08 NOTE — Telephone Encounter (Signed)
 LMTCB for pt

## 2024-02-10 ENCOUNTER — Other Ambulatory Visit: Payer: Self-pay

## 2024-02-10 ENCOUNTER — Emergency Department (HOSPITAL_COMMUNITY): Admission: EM | Admit: 2024-02-10 | Discharge: 2024-02-10 | Disposition: A | Discharge status: Home / Self Care

## 2024-02-10 ENCOUNTER — Emergency Department (HOSPITAL_COMMUNITY)

## 2024-02-10 ENCOUNTER — Encounter (HOSPITAL_COMMUNITY): Payer: Self-pay | Admitting: Emergency Medicine

## 2024-02-10 DIAGNOSIS — K625 Hemorrhage of anus and rectum: Secondary | ICD-10-CM | POA: Diagnosis not present

## 2024-02-10 DIAGNOSIS — R10A2 Flank pain, left side: Secondary | ICD-10-CM | POA: Diagnosis present

## 2024-02-10 DIAGNOSIS — Z7901 Long term (current) use of anticoagulants: Secondary | ICD-10-CM | POA: Insufficient documentation

## 2024-02-10 HISTORY — DX: Iron deficiency anemia, unspecified: D50.9

## 2024-02-10 LAB — CBC
HCT: 37.1 % (ref 36.0–46.0)
Hemoglobin: 12.2 g/dL (ref 12.0–15.0)
MCH: 32.4 pg (ref 26.0–34.0)
MCHC: 32.9 g/dL (ref 30.0–36.0)
MCV: 98.4 fL (ref 80.0–100.0)
Platelets: 291 K/uL (ref 150–400)
RBC: 3.77 MIL/uL — ABNORMAL LOW (ref 3.87–5.11)
RDW: 13.4 % (ref 11.5–15.5)
WBC: 8.7 K/uL (ref 4.0–10.5)
nRBC: 0 % (ref 0.0–0.2)

## 2024-02-10 LAB — TYPE AND SCREEN
ABO/RH(D): A POS
Antibody Screen: NEGATIVE

## 2024-02-10 LAB — COMPREHENSIVE METABOLIC PANEL WITH GFR
ALT: 15 U/L (ref 0–44)
AST: 21 U/L (ref 15–41)
Albumin: 4.6 g/dL (ref 3.5–5.0)
Alkaline Phosphatase: 51 U/L (ref 38–126)
Anion gap: 13 (ref 5–15)
BUN: 8 mg/dL (ref 6–20)
CO2: 21 mmol/L — ABNORMAL LOW (ref 22–32)
Calcium: 9.7 mg/dL (ref 8.9–10.3)
Chloride: 106 mmol/L (ref 98–111)
Creatinine, Ser: 0.63 mg/dL (ref 0.44–1.00)
GFR, Estimated: >60 mL/min
Glucose, Bld: 78 mg/dL (ref 70–99)
Potassium: 4.1 mmol/L (ref 3.5–5.1)
Sodium: 140 mmol/L (ref 135–145)
Total Bilirubin: 0.6 mg/dL (ref 0.0–1.2)
Total Protein: 7.7 g/dL (ref 6.5–8.1)

## 2024-02-10 LAB — POC OCCULT BLOOD, ED

## 2024-02-10 LAB — HCG, SERUM, QUALITATIVE: Preg, Serum: NEGATIVE

## 2024-02-10 MED ORDER — IOHEXOL 350 MG/ML SOLN
100.0000 mL | Freq: Once | INTRAVENOUS | Status: AC | PRN
  Administered 2024-02-10: 100 mL via INTRAVENOUS

## 2024-02-10 NOTE — Discharge Instructions (Signed)
 Your lab work and imaging did not show any significant abnormality.  Please follow-up with your GI doctor for further evaluation and management.  As discussed please drink plenty of fluids, increase your fiber intake, use sitz bath's and stay active for pain relief.

## 2024-02-10 NOTE — ED Provider Notes (Signed)
 " [place] EMERGENCY DEPARTMENT AT [facility] Provider Note   CSN: [id number] Arrival date & time: 02/10/24  1348     Patient presents with: GI Problem   Alicia Branch is a 34 y.o. female with history of nutcracker syndrome presents with multiple complaints.  She complains of rectal pain, constipation with onset of rectal bleeding.  No history of GI bleed.  Denies any history of hemorrhoids.  Has tried castor oil and Preparation H without any significant relief.  Although she did have a bowel movement today following the castor oil.  Has had some generalized abdominal pain without nausea, vomiting.  Additionally complaining of left flank pain.  States that this is the pain that she felt before she was diagnosed with a DVT.  Does admit to self discontinuing xarelto .  No urinary symptoms.    GI Problem      Past Medical History:  Diagnosis Date   Acute deep vein thrombosis (DVT) of left side of pelvic region    Anemia    Iron deficiency anemia, unspecified    Past Surgical History:  Procedure Laterality Date   CESAREAN SECTION     CHOLECYSTECTOMY N/A 06/02/2014   Procedure: LAPAROSCOPIC CHOLECYSTECTOMY WITH INTRAOPERATIVE CHOLANGIOGRAM;  Surgeon: Donnice Lunger, MD;  Location: [facility];  Service: General;  Laterality: N/A;   IR RADIOLOGIST EVAL & MGMT  08/16/2021   IR RADIOLOGIST EVAL & MGMT  03/14/2022   TUBAL LIGATION  10/05/2011   Procedure: POST PARTUM TUBAL LIGATION;  Surgeon: Gloris DELENA Hugger, MD;  Location: [facility];  Service: Gynecology;  Laterality: Bilateral;     Prior to Admission medications  Medication Sig Start Date End Date Taking? Authorizing Provider  amoxicillin (AMOXIL) 500 MG tablet Take 500 mg by mouth 3 (three) times daily. 01/29/24   [provider]  ferrous sulfate  325 (65 FE) MG EC tablet Take 325 mg by mouth 3 (three) times daily with meals.    [provider]  folic acid  (FOLVITE ) 1 MG tablet Take 2 tablets (2 mg total) by mouth  daily. Patient not taking: Reported on 01/31/2024 12/03/23   Tonette Lauraine HERO, PA-C  lactulose  (CHRONULAC ) 10 GM/15ML solution Take 15 mLs (10 g total) by mouth 3 (three) times daily as needed for mild constipation. Patient not taking: Reported on 01/31/2024 12/14/21   Franchot Lauraine HERO, NP  naproxen  (NAPROSYN ) 500 MG tablet Take 1 tablet (500 mg total) by mouth 2 (two) times daily. Patient not taking: Reported on 01/31/2024 01/08/23   Ward, Harlene Z, PA-C  rivaroxaban  (XARELTO ) 20 MG TABS tablet Take 1 tablet (20 mg total) by mouth daily with supper. Patient not taking: Reported on 01/31/2024 12/03/23   Tonette Lauraine HERO, PA-C    Allergies: Patient has no known allergies.    Review of Systems  Gastrointestinal:  Positive for rectal pain.    Updated Vital Signs BP 114/64   Pulse 93   Temp 98.9 F (37.2 C) (Oral)   Resp 17   Ht 5' 1 (1.549 m)   Wt 59 kg   LMP 01/21/2024 (Approximate)   SpO2 96%   BMI 24.56 kg/m   Physical Exam Vitals and nursing note reviewed.  Constitutional:      General: She is not in acute distress.    Appearance: She is well-developed.  HENT:     Head: Normocephalic and atraumatic.  Eyes:     Conjunctiva/sclera: Conjunctivae normal.  Cardiovascular:     Rate and Rhythm: Normal rate  and regular rhythm.     Heart sounds: No murmur heard. Pulmonary:     Effort: Pulmonary effort is normal. No respiratory distress.     Breath sounds: Normal breath sounds.  Abdominal:     Palpations: Abdomen is soft.     Comments: No significant abdominal pain, soft nondistended.  Does have left CVAT  Musculoskeletal:        General: No swelling.     Cervical back: Neck supple.  Skin:    General: Skin is warm and dry.     Capillary Refill: Capillary refill takes less than 2 seconds.  Neurological:     Mental Status: She is alert.  Psychiatric:        Mood and Affect: Mood normal.     (all labs ordered are listed, but only abnormal results are displayed) Labs  Reviewed  COMPREHENSIVE METABOLIC PANEL WITH GFR - Abnormal; Notable for the following components:      Result Value   CO2 21 (*)    All other components within normal limits  CBC - Abnormal; Notable for the following components:   RBC 3.77 (*)    All other components within normal limits  HCG, SERUM, QUALITATIVE  POC OCCULT BLOOD, ED  POC URINE PREG, ED  TYPE AND SCREEN    EKG: None  Radiology: CT Angio Abd/Pel W and/or Wo Contrast Result Date: 02/10/2024 EXAM: CTA ABDOMEN AND PELVIS WITH AND WITHOUT CONTRAST 02/10/2024 05:04:54 PM TECHNIQUE: CTA images of the abdomen and pelvis with and without intravenous contrast. 100 mL iohexol  (OMNIPAQUE ) 350 MG/ML injection. Three-dimensional MIP/volume rendered formations were performed. Automated exposure control, iterative reconstruction, and/or weight based adjustment of the mA/kV was utilized to reduce the radiation dose to as low as reasonably achievable. COMPARISON: 03/08/2022. CLINICAL HISTORY: L flank pain, hx of nutcracker. FINDINGS: VASCULATURE: The left gonadal vein is enlarged. There are enlarged veins in the left pelvis similar to prior (image 63 or series 17). No change for comparison exam. No thrombus within these veins. The left renal vein decreases in caliber as it passes from left to right beneath the SMA but no high grade obstruction. The vein is patent. Caliber change is similar to comparison exam. No evidence of occlusion or obstruction of the left femoral vein. Caliber change is similar to comparison exam. AORTA: No acute finding. No abdominal aortic aneurysm. No dissection. CELIAC TRUNK: No acute finding. No occlusion or significant stenosis. SUPERIOR MESENTERIC ARTERY: No acute finding. No occlusion or significant stenosis. RENAL ARTERIES: No acute finding. No occlusion or significant stenosis. ILIAC ARTERIES: No acute finding. No occlusion or significant stenosis. LIVER: Post cholecystectomy. GALLBLADDER AND BILE DUCTS: Post  cholecystectomy. No biliary ductal dilatation. SPLEEN: The spleen is unremarkable. PANCREAS: The pancreas is unremarkable. ADRENAL GLANDS: Bilateral adrenal glands demonstrate no acute abnormality. KIDNEYS, URETERS AND BLADDER: No stones in the kidneys or ureters. No hydronephrosis. No perinephric or periureteral stranding. Urinary bladder is unremarkable. GI AND BOWEL: Stomach and duodenal sweep demonstrate no acute abnormality. There is no bowel obstruction. No abnormal bowel wall thickening or distension. No bowel inflammation. REPRODUCTIVE: Uterus and ovaries are normal. PERITONEUM AND RETROPERITONEUM: No ascites or free air. LUNG BASE: No acute abnormality. LYMPH NODES: No lymphadenopathy. BONES AND SOFT TISSUES: No acute abnormality of the bones. No acute soft tissue abnormality. IMPRESSION: 1. Evidence of venous congestion with enlargement of the left gonadal vein and prominent venous vessels in the left pelvis, with no change from the prior exam and no thrombus within the  vessels. 2. No evidence of occlusion or obstruction of the left femoral vein, with caliber change similar to the prior exam, 3.  no clear acute findings in the abdomen or pelvis. Electronically signed by: Norleen Boxer MD 02/10/2024 05:54 PM EST RP Workstation: [id number]     Procedures   Medications Ordered in the ED  iohexol  (OMNIPAQUE ) 350 MG/ML injection 100 mL (100 mLs Intravenous Contrast Given 02/10/24 1656)    Clinical Course as of 02/10/24 1832  Sun Feb 10, 2024  1425 Patient with history of nutcracker syndrome evaluated for left flank pain, generalized abdominal pain, rectal bleeding and pain.  Upon arrival patient is hemodynamically stable and nontoxic-appearing.  Given recurrent flank pain since discontinuing her Xarelto  will obtain CT angio in addition to routine labs [JT]  1512 CBC(!) No significant normality [JT]  1559 Comprehensive metabolic panel(!) No significant abnormality [JT]  1821 CT Angio Abd/Pel W  and/or Wo Contrast Evidence of venous congestion with enlargement of the left gonadal vein and prominent venous vessels in the left pelvis with no change from the prior exam and no thrombus within vessels [JT]  1829 Overall workup reassuring.  Patient will be discharged home.  She has a GI doctor that she can follow-up regarding further management of her noncritically syndrome and possible internal hemorrhoids.  Patient instructed on supportive care including fiber intake, fluids, activity, sitz bath's, Preparation H.  Strict return precautions provided.  Patient is understanding and agreeable to plan. [JT]    Clinical Course User Index [JT] Donnajean Lynwood DEL, PA-C                                 Medical Decision Making Amount and/or Complexity of Data Reviewed Labs: ordered. Decision-making details documented in ED Course. Radiology: ordered. Decision-making details documented in ED Course.  Risk Prescription drug management.   This patient presents to the ED with chief complaint(s) of rectal and flank pain.  The complaint involves an extensive differential diagnosis and also carries with it a high risk of complications and morbidity.   Pertinent past medical history as listed in HPI  The differential diagnosis includes  Recurrent DVT, hemorrhoids, GI bleed, mass effect, necrotizing fasciitis, gangrene, pyelonephritis, nephrolithiasis, cystitis Additional history obtained: Records reviewed Care Everywhere/External Records  Disposition:   Patient will be discharged home. The patient has been appropriately medically screened and/or stabilized in the ED. I have low suspicion for any other emergent medical condition which would require further screening, evaluation or treatment in the ED or require inpatient management. At time of discharge the patient is hemodynamically stable and in no acute distress. I have discussed work-up results and diagnosis with patient and answered all questions.  Patient is agreeable with discharge plan. We discussed strict return precautions for returning to the emergency department and they verbalized understanding.     Social Determinants of Health:   none  This note was dictated with voice recognition software.  Despite best efforts at proofreading, errors may have occurred which can change the documentation meaning.       Final diagnoses:  Rectal bleeding  Left flank pain    ED Discharge Orders     None          Donnajean Lynwood DEL DEVONNA 02/10/24 1832  "

## 2024-02-10 NOTE — ED Triage Notes (Signed)
 Pov c/o rectal bleeding that started 2 days ago. Pt stated was constipated 2 weeks ago and was advised by a friend to take castor oil. Pt states the castor oil worked however has noted bright red blood when they go to the bathroom since taking castor oil. Pt has tried prep-H without any relief. Pt denies having hemorrhoids or any rectal concerns.   Pt was scheduled to have a routine EGD but was cancelled.

## 2024-02-11 ENCOUNTER — Ambulatory Visit (HOSPITAL_BASED_OUTPATIENT_CLINIC_OR_DEPARTMENT_OTHER)

## 2024-02-12 ENCOUNTER — Encounter: Payer: Self-pay | Admitting: *Deleted

## 2024-02-12 NOTE — Telephone Encounter (Signed)
 Pt was offered 02/13/24 for procedure. She says that was too soon. She has been scheduled for 02/26/24. Instructions printed and placed up front for pt to pick up. Pt was told to make sure that she has another individual with her at her procedure. Pt verbalized understanding.

## 2024-02-12 NOTE — Addendum Note (Signed)
 Addended by: GAYLENE MADELIN CROME on: 02/12/2024 09:47 AM   Modules accepted: Orders

## 2024-02-15 ENCOUNTER — Ambulatory Visit (HOSPITAL_BASED_OUTPATIENT_CLINIC_OR_DEPARTMENT_OTHER)

## 2024-02-21 ENCOUNTER — Encounter (HOSPITAL_COMMUNITY)
Admission: RE | Admit: 2024-02-21 | Discharge: 2024-02-21 | Disposition: A | Discharge status: Home / Self Care | Source: Ambulatory Visit | Attending: Internal Medicine | Admitting: Internal Medicine

## 2024-02-21 ENCOUNTER — Other Ambulatory Visit: Payer: Self-pay

## 2024-02-21 ENCOUNTER — Encounter (HOSPITAL_COMMUNITY): Payer: Self-pay

## 2024-02-21 DIAGNOSIS — Z01818 Encounter for other preprocedural examination: Secondary | ICD-10-CM

## 2024-02-22 ENCOUNTER — Telehealth: Payer: Self-pay | Admitting: *Deleted

## 2024-02-22 NOTE — Telephone Encounter (Signed)
 Spoke with pt. She has been rescheduled to 2/11 at 11:30. Aware will send new instructions via mychart and she would get it .

## 2024-03-05 ENCOUNTER — Encounter (HOSPITAL_COMMUNITY): Admission: RE | Payer: Self-pay | Source: Home / Self Care

## 2024-03-05 ENCOUNTER — Ambulatory Visit (HOSPITAL_COMMUNITY): Admission: RE | Admit: 2024-03-05 | Source: Home / Self Care | Admitting: Internal Medicine

## 2024-06-02 ENCOUNTER — Inpatient Hospital Stay: Admitting: Family

## 2024-06-02 ENCOUNTER — Inpatient Hospital Stay
